# Patient Record
Sex: Male | Born: 1980
Health system: Southern US, Community
[De-identification: ages and names within clinical notes are randomized; demographics above are authoritative.]

## PROBLEM LIST (undated history)

## (undated) DIAGNOSIS — M79602 Pain in left arm: Secondary | ICD-10-CM

## (undated) DIAGNOSIS — F1120 Opioid dependence, uncomplicated: Secondary | ICD-10-CM

## (undated) DIAGNOSIS — R079 Chest pain, unspecified: Secondary | ICD-10-CM

## (undated) DIAGNOSIS — Z789 Other specified health status: Secondary | ICD-10-CM

## (undated) DIAGNOSIS — T7840XA Allergy, unspecified, initial encounter: Secondary | ICD-10-CM

## (undated) DIAGNOSIS — F32A Depression, unspecified: Secondary | ICD-10-CM

## (undated) DIAGNOSIS — S39012A Strain of muscle, fascia and tendon of lower back, initial encounter: Secondary | ICD-10-CM

## (undated) DIAGNOSIS — L739 Follicular disorder, unspecified: Secondary | ICD-10-CM

## (undated) DIAGNOSIS — F1920 Other psychoactive substance dependence, uncomplicated: Secondary | ICD-10-CM

## (undated) DIAGNOSIS — K209 Esophagitis, unspecified without bleeding: Secondary | ICD-10-CM

## (undated) DIAGNOSIS — N50819 Testicular pain, unspecified: Secondary | ICD-10-CM

## (undated) HISTORY — DX: Follicular disorder, unspecified: L73.9

## (undated) HISTORY — DX: Strain of muscle, fascia and tendon of lower back, initial encounter: S39.012A

## (undated) HISTORY — DX: Pain in left arm: M79.602

## (undated) HISTORY — DX: Other specified health status: Z78.9

## (undated) HISTORY — DX: Chest pain, unspecified: R07.9

## (undated) HISTORY — DX: Allergy, unspecified, initial encounter: T78.40XA

## (undated) HISTORY — DX: Esophagitis, unspecified without bleeding: K20.90

## (undated) HISTORY — DX: Testicular pain, unspecified: N50.819

## (undated) HISTORY — DX: Depression, unspecified: F32.A

---

## 1998-04-19 ENCOUNTER — Emergency Department (HOSPITAL_COMMUNITY): Admission: EM | Admit: 1998-04-19 | Discharge: 1998-04-19 | Payer: Self-pay

## 1998-04-22 ENCOUNTER — Encounter: Admission: RE | Admit: 1998-04-22 | Discharge: 1998-04-22 | Payer: Self-pay | Admitting: *Deleted

## 1999-06-24 ENCOUNTER — Encounter: Payer: Self-pay | Admitting: Emergency Medicine

## 1999-06-24 ENCOUNTER — Emergency Department (HOSPITAL_COMMUNITY): Admission: EM | Admit: 1999-06-24 | Discharge: 1999-06-24 | Payer: Self-pay | Admitting: Emergency Medicine

## 1999-11-03 ENCOUNTER — Emergency Department (HOSPITAL_COMMUNITY): Admission: EM | Admit: 1999-11-03 | Discharge: 1999-11-03 | Payer: Self-pay | Admitting: *Deleted

## 2000-02-10 ENCOUNTER — Emergency Department (HOSPITAL_COMMUNITY): Admission: EM | Admit: 2000-02-10 | Discharge: 2000-02-10 | Payer: Self-pay | Admitting: Emergency Medicine

## 2000-08-26 ENCOUNTER — Encounter: Payer: Self-pay | Admitting: Emergency Medicine

## 2000-08-26 ENCOUNTER — Emergency Department (HOSPITAL_COMMUNITY): Admission: EM | Admit: 2000-08-26 | Discharge: 2000-08-26 | Payer: Self-pay

## 2001-06-17 ENCOUNTER — Emergency Department (HOSPITAL_COMMUNITY): Admission: EM | Admit: 2001-06-17 | Discharge: 2001-06-17 | Payer: Self-pay | Admitting: Emergency Medicine

## 2001-08-21 ENCOUNTER — Emergency Department (HOSPITAL_COMMUNITY): Admission: EM | Admit: 2001-08-21 | Discharge: 2001-08-21 | Payer: Self-pay | Admitting: Emergency Medicine

## 2001-08-23 ENCOUNTER — Emergency Department (HOSPITAL_COMMUNITY): Admission: EM | Admit: 2001-08-23 | Discharge: 2001-08-24 | Payer: Self-pay | Admitting: Emergency Medicine

## 2003-04-02 ENCOUNTER — Emergency Department (HOSPITAL_COMMUNITY): Admission: EM | Admit: 2003-04-02 | Discharge: 2003-04-02 | Payer: Self-pay | Admitting: Emergency Medicine

## 2004-05-31 ENCOUNTER — Emergency Department (HOSPITAL_COMMUNITY): Admission: EM | Admit: 2004-05-31 | Discharge: 2004-05-31 | Payer: Self-pay | Admitting: *Deleted

## 2004-06-03 ENCOUNTER — Emergency Department (HOSPITAL_COMMUNITY): Admission: EM | Admit: 2004-06-03 | Discharge: 2004-06-03 | Payer: Self-pay | Admitting: Emergency Medicine

## 2006-12-14 ENCOUNTER — Emergency Department (HOSPITAL_COMMUNITY): Admission: EM | Admit: 2006-12-14 | Discharge: 2006-12-15 | Payer: Self-pay | Admitting: Emergency Medicine

## 2007-01-09 ENCOUNTER — Encounter: Admission: RE | Admit: 2007-01-09 | Discharge: 2007-01-09 | Payer: Self-pay | Admitting: Family Medicine

## 2007-01-09 ENCOUNTER — Ambulatory Visit: Payer: Self-pay | Admitting: Family Medicine

## 2007-04-23 ENCOUNTER — Ambulatory Visit: Payer: Self-pay | Admitting: Family Medicine

## 2007-09-30 ENCOUNTER — Ambulatory Visit: Payer: Self-pay | Admitting: Family Medicine

## 2007-09-30 DIAGNOSIS — M549 Dorsalgia, unspecified: Secondary | ICD-10-CM

## 2007-09-30 HISTORY — DX: Dorsalgia, unspecified: M54.9

## 2007-12-09 ENCOUNTER — Emergency Department (HOSPITAL_COMMUNITY): Admission: EM | Admit: 2007-12-09 | Discharge: 2007-12-09 | Payer: Self-pay | Admitting: Emergency Medicine

## 2007-12-11 DIAGNOSIS — R071 Chest pain on breathing: Secondary | ICD-10-CM

## 2007-12-11 HISTORY — DX: Chest pain on breathing: R07.1

## 2007-12-12 ENCOUNTER — Ambulatory Visit: Payer: Self-pay | Admitting: Family Medicine

## 2007-12-18 ENCOUNTER — Telehealth (INDEPENDENT_AMBULATORY_CARE_PROVIDER_SITE_OTHER): Payer: Self-pay | Admitting: *Deleted

## 2007-12-19 ENCOUNTER — Ambulatory Visit: Payer: Self-pay | Admitting: Family Medicine

## 2007-12-19 DIAGNOSIS — F17200 Nicotine dependence, unspecified, uncomplicated: Secondary | ICD-10-CM

## 2007-12-19 HISTORY — DX: Nicotine dependence, unspecified, uncomplicated: F17.200

## 2008-04-27 ENCOUNTER — Ambulatory Visit: Payer: Self-pay | Admitting: Family Medicine

## 2008-05-04 ENCOUNTER — Encounter: Payer: Self-pay | Admitting: Family Medicine

## 2008-05-04 ENCOUNTER — Telehealth: Payer: Self-pay | Admitting: Family Medicine

## 2008-05-05 ENCOUNTER — Ambulatory Visit: Payer: Self-pay | Admitting: Family Medicine

## 2008-05-07 ENCOUNTER — Ambulatory Visit: Payer: Self-pay | Admitting: Family Medicine

## 2008-05-14 ENCOUNTER — Encounter: Admission: RE | Admit: 2008-05-14 | Discharge: 2008-05-14 | Payer: Self-pay | Admitting: Orthopedic Surgery

## 2008-05-16 ENCOUNTER — Encounter: Admission: RE | Admit: 2008-05-16 | Discharge: 2008-05-16 | Payer: Self-pay | Admitting: Orthopedic Surgery

## 2008-05-28 ENCOUNTER — Telehealth: Payer: Self-pay | Admitting: Family Medicine

## 2008-05-31 ENCOUNTER — Telehealth: Payer: Self-pay | Admitting: Family Medicine

## 2008-06-01 ENCOUNTER — Telehealth: Payer: Self-pay | Admitting: *Deleted

## 2008-06-02 ENCOUNTER — Encounter: Admission: RE | Admit: 2008-06-02 | Discharge: 2008-06-02 | Payer: Self-pay | Admitting: Orthopedic Surgery

## 2008-07-20 ENCOUNTER — Encounter: Admission: RE | Admit: 2008-07-20 | Discharge: 2008-08-03 | Payer: Self-pay | Admitting: Orthopedic Surgery

## 2008-09-14 DIAGNOSIS — M719 Bursopathy, unspecified: Secondary | ICD-10-CM

## 2008-09-14 DIAGNOSIS — M67919 Unspecified disorder of synovium and tendon, unspecified shoulder: Secondary | ICD-10-CM | POA: Insufficient documentation

## 2008-09-14 HISTORY — DX: Unspecified disorder of synovium and tendon, unspecified shoulder: M67.919

## 2008-09-14 HISTORY — DX: Unspecified disorder of synovium and tendon, unspecified shoulder: M71.9

## 2008-09-28 ENCOUNTER — Ambulatory Visit: Payer: Self-pay | Admitting: Family Medicine

## 2008-10-04 ENCOUNTER — Telehealth: Payer: Self-pay | Admitting: *Deleted

## 2008-11-04 ENCOUNTER — Ambulatory Visit: Payer: Self-pay | Admitting: Family Medicine

## 2009-05-18 ENCOUNTER — Encounter
Admission: RE | Admit: 2009-05-18 | Discharge: 2009-08-16 | Payer: Self-pay | Admitting: Physical Medicine & Rehabilitation

## 2009-05-19 ENCOUNTER — Ambulatory Visit: Payer: Self-pay | Admitting: Physical Medicine & Rehabilitation

## 2009-06-27 ENCOUNTER — Telehealth: Payer: Self-pay | Admitting: Family Medicine

## 2009-07-13 ENCOUNTER — Encounter
Admission: RE | Admit: 2009-07-13 | Discharge: 2009-07-13 | Payer: Self-pay | Admitting: Physical Medicine & Rehabilitation

## 2009-08-15 ENCOUNTER — Telehealth: Payer: Self-pay | Admitting: Family Medicine

## 2009-08-18 ENCOUNTER — Telehealth: Payer: Self-pay | Admitting: Family Medicine

## 2010-01-28 ENCOUNTER — Emergency Department (HOSPITAL_COMMUNITY): Admission: EM | Admit: 2010-01-28 | Discharge: 2010-01-28 | Payer: Self-pay | Admitting: Emergency Medicine

## 2010-11-30 NOTE — Assessment & Plan Note (Signed)
Summary: WANTS HIS HEART CK/JLS   Vital Signs:  Patient Profile:   30 Years Old Male Weight:      271 pounds Temp:     98.6 degrees F oral Pulse rate:   64 / minute BP sitting:   126 / 88  (right arm)  Vitals Entered By: Kern Reap CMA (November 04, 2008 12:05 PM)                 Chief Complaint:  concerned with heart disease.  History of Present Illness: Phillip Anderson is a 30 year old, married male, smoker, who comes in today because he is concerned about his heart.  His father and mother both have hypertension.  His father is a diabetic.  Phillip Anderson smokes a pack of cigarettes a day, and expresses his desire to quit.  Recently, his wife's father dropped dead of heart attack at age 32.  Risks factors smoking and hypertension    Current Allergies: No known allergies   Past Medical History:    Reviewed history from 09/28/2008 and no changes required:       Unremarkable       tobacco abuse       cystic lesion, right scapula   Family History:    Reviewed history from 12/12/2007 and no changes required:       Family History Diabetes 1st degree relative       Family History Hypertension  Social History:    Reviewed history from 12/12/2007 and no changes required:       Occupation: Clinical research associate distribution       Married       Never Smoked       Alcohol use-no       Drug use-no       Regular exercise-yes   Risk Factors:     Counseled to quit/cut down tobacco use:  yes   Review of Systems      See HPI   Physical Exam  General:     Well-developed,well-nourished,in no acute distress; alert,appropriate and cooperative throughout examination Neck:     No deformities, masses, or tenderness noted. Chest Wall:     No deformities, masses, tenderness or gynecomastia noted. Lungs:     Normal respiratory effort, chest expands symmetrically. Lungs are clear to auscultation, no crackles or wheezes. Heart:     Normal rate and regular rhythm. S1 and S2 normal  without gallop, murmur, click, rub or other extra sounds.    Impression & Recommendations:  Problem # 1:  TOBACCO ABUSE (ICD-305.1) Assessment: Unchanged  His updated medication list for this problem includes:    Chantix Starting Month Pak 0.5 Mg X 11 & 1 Mg X 42 Misc (Varenicline tartrate) ..... Uad   Problem # 2:  FAMILY HISTORY DIABETES 1ST DEGREE RELATIVE (ICD-V18.0) Assessment: Unchanged  Complete Medication List: 1)  Flexeril 10 Mg Tabs (Cyclobenzaprine hcl) .Marland Kitchen.. 1 tab @ bedtime 2)  Vicodin Es 7.5-750 Mg Tabs (Hydrocodone-acetaminophen) .Marland Kitchen.. 1 tab @ bedtime 3)  Chantix Starting Month Pak 0.5 Mg X 11 & 1 Mg X 42 Misc (Varenicline tartrate) .... Uad   Patient Instructions: 1)  start the smoking cessation program, that I have outlined for you.  Set up for fasting blood work next week.  Return to see me a week after your lab work for follow-up 2)  BMP prior to visit, ICD-9:  v70.0 3)  Hepatic Panel prior to visit, ICD-9: 4)  Lipid Panel prior to visit, ICD-9: 5)  TSH prior to visit, ICD-9: 6)  CBC w/ Diff prior to visit, ICD-9: 7)  Urine-dip prior to visit, ICD-9: 8)  HbgA1C prior to visit, ICD-9:   Prescriptions: CHANTIX STARTING MONTH PAK 0.5 MG X 11 & 1 MG X 42 MISC (VARENICLINE TARTRATE) UAD  #1 x 0   Entered and Authorized by:   Roderick Pee MD   Signed by:   Roderick Pee MD on 11/04/2008   Method used:   Print then Give to Patient   RxID:   [id number]  ]

## 2010-11-30 NOTE — Progress Notes (Signed)
Summary: FYI  Phone Note Call from Patient Call back at [phone]   Caller: pt live Call For: Tawanna Cooler Summary of Call: patient wants to talk to [facility]  Initial call taken by: Roselle Locus,  May 28, 2008 8:58 AM  Follow-up for Phone Call        pt needs paperwork filled out. pt is not sure if Dr Tawanna Cooler or if his specialist should fill it out.  pt is going to have a copy of the paperwork faxed here. Follow-up by: Kern Reap CMA,  May 28, 2008 10:05 AM  Additional Follow-up for Phone Call Additional follow up Details #1::        paperwork needs to be filled out by the physician has been treating him Additional Follow-up by: Roderick Pee MD,  May 31, 2008 7:47 AM    Additional Follow-up for Phone Call Additional follow up Details #2::    pt aware Follow-up by: Kern Reap CMA,  May 31, 2008 2:18 PM

## 2010-11-30 NOTE — Progress Notes (Signed)
Summary: neurologic appointment  Phone Note Call from Patient Call back at [phone]   Summary of Call: patient is calling because he is going to the neurologist for an appointment soon.  is there anything he can do for his pain until then? Initial call taken by: Kern Reap CMA Duncan Dull),  August 18, 2009 12:39 PM  Follow-up for Phone Call         we need to name of the neurologist and  when his appointment is???????????  then Fleet Contras we need to call and verify the above to be able to give him a short quantity of rx Follow-up by: Roderick Pee MD,  August 18, 2009 12:52 PM  Additional Follow-up for Phone Call Additional follow up Details #1::        patient does not have an appointment yet.  his information has been faxed to [employer] [facility]. and he is waiting to hear back from them. Additional Follow-up by: Kern Reap CMA Duncan Dull),  August 18, 2009 3:57 PM

## 2010-11-30 NOTE — Letter (Signed)
Summary: Out of Work  Adult nurse at [employer]  [address]   [place], [place] [address]   Phone: [phone]  Fax: [phone]    April 27, 2008   Employee:  AITAN ROSSBACH Patel    To Whom It May Concern:   For Medical reasons, please excuse the above named employee from work for the following dates:  Start:   April 27, 2008    End:   May 29, 2008  If you need additional information, please feel free to contact our office.         Sincerely,    Kelle Darting, MD

## 2010-11-30 NOTE — Progress Notes (Signed)
Summary: pt no better  Phone Note Call from Patient Call back at Home Phone [phone]   Caller: Patient Call For: dt todd Reason for Call: Talk to Doctor Summary of Call: pt back pain no better pt would like something [phone] Initial call taken by: Heron Sabins,  December 18, 2007 9:08 AM  Follow-up for Phone Call        PT CALLED AGAIN FOR STATUS. ..................................................................Marland KitchenWarnell Forester  December 18, 2007 11:13 AM  Additional Follow-up for Phone Call Additional follow up Details #1::        patient taking Vicodin and Flexeril 3 times a day.  If that's not controlling his pain we need to see him today for re-examination Additional Follow-up by: Roderick Pee MD,  December 18, 2007 1:30 PM    Additional Follow-up for Phone Call Additional follow up Details #2::    pt called and he made an appt for tomorrow am per dr's order. Follow-up by: Warnell Forester,  December 18, 2007 4:29 PM

## 2010-11-30 NOTE — Progress Notes (Signed)
Summary: patient asks for your referral again  Phone Note Call from Patient Call back at [phone]   Caller: Patient Call For: todd Reason for Call: Talk to Nurse Summary of Call: Having back pain, would like to speak with nurse to see what he should do.  Called patient.  He is requesting refill of pain med & muscle relaxer so he can get some sleep.  Feels so bad he wants to get it cut out.  aPPOINTMENT MADE wED 10-20 AT 11:00, he can't get here until then.  No physician [place].  [facility] [address] [place], [place].  Request call back.   [phone].  NKDA.   Initial call taken by: Trixie Dredge,  August 15, 2009 10:48 AM  Follow-up for Phone Call        patient was referred to Dr. Omelia Blackwater [facility] recommend he see one of those two people for further evaluation since we were not able to find a diagnosis Follow-up by: Roderick Pee MD,  August 15, 2009 1:45 PM  Additional Follow-up for Phone Call Additional follow up Details #1::        Patient said he wen to Dr. Cleophas Dunker who sent him to Dr. Madelon Lips who sent him to [facility], a Dr. [place]?, sho sent him back to another [place] doctor?  What to do or who to go to for surgery?   Additional Follow-up by: Rudy Jew, RN,  August 15, 2009 3:00 PM    Additional Follow-up for Phone Call Additional follow up Details #2::    I would recommend that he see the last person.  He saw Follow-up by: Roderick Pee MD,  August 15, 2009 3:04 PM  Additional Follow-up for Phone Call Additional follow up Details #3:: Details for Additional Follow-up Action Taken: patient is aware Additional Follow-up by: Kern Reap CMA Duncan Dull),  August 15, 2009 5:28 PM   Appended Document: Orders Update    Clinical Lists Changes  Orders: Added new Referral order of Neurology Referral (Neuro) - Signed

## 2010-11-30 NOTE — Assessment & Plan Note (Signed)
Summary: left arm pain/mhf   Vital Signs:  Patient Profile:   30 Years Old Male Weight:      272 pounds Temp:     98.3 degrees F oral Pulse rate:   78 / minute Pulse rhythm:   regular BP sitting:   130 / 90  (left arm) Cuff size:   regular  Vitals Entered By: Kern Reap CMA (September 28, 2008 2:31 PM)                 Chief Complaint:  left shoulder pain.  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  He states two weeks ago.  He woke up with severe pain in his left shoulder.  He denies any history of trauma.  He describes the pain as constant, dull.  It was originally a 7 on a scale of one to 10 ninths and 9.  He also says the pain radiates down into his left biceps.  He's had no history of neck trauma, nor shoulder trauma in the past.  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  He also has trauma to his right shoulder.  We originally sent him to Dr. Cleophas Dunker is seen in the [facility].  It turns out that he has a cyst in his left scapula.  This is being followed at [facility] in    Current Allergies: No known allergies   Past Medical History:    Reviewed history from 12/12/2007 and no changes required:       Unremarkable              cystic lesion, right scapula   Social History:    Reviewed history from 12/12/2007 and no changes required:       Occupation: Clinical research associate distribution       Married       Never Smoked       Alcohol use-no       Drug use-no       Regular exercise-yes   Risk Factors:     Counseled to quit/cut down tobacco use:  yes   Review of Systems      See HPI   Physical Exam  General:     Well-developed,well-nourished,in no acute distress; alert,appropriate and cooperative throughout examination Msk:     palpable tenderness left biceps tendon otherwise, exam negative Pulses:  R and L carotid,radial,femoral,dorsalis pedis and posterior tibial pulses are full and equal bilaterally Extremities:     No clubbing, cyanosis, edema, or deformity noted with normal full range of motion of all joints.   Neurologic:     No cranial nerve deficits noted. Station and gait are normal. Plantar reflexes are down-going bilaterally. DTRs are symmetrical throughout. Sensory, motor and coordinative functions appear intact.    Impression & Recommendations:  Problem # 1:  TOBACCO ABUSE (ICD-305.1) Assessment: Unchanged  Orders: Tobacco use cessation intermediate 3-10 minutes (99406)   Problem # 2:  TENDINITIS, SHOULDER, LEFT (ICD-726.10) Assessment: New  Orders: Tobacco use cessation intermediate 3-10 minutes (99406)   Complete Medication List: 1)  Flexeril 10 Mg Tabs (Cyclobenzaprine hcl) .Marland Kitchen.. 1 tab @ bedtime 2)  Vicodin Es 7.5-750 Mg Tabs (Hydrocodone-acetaminophen) .Marland Kitchen.. 1 tab @ bedtime   Patient Instructions: 1)  take Motrin, 600 mg 3 times a day with food.  It may also take a half  of Vicodin and a half of a Flexeril at bedtime as needed for nighttime pain.  If you don't see any improvement in a week.  Call me we will get you set up for physical therapy. 2)  Please call the doctors at [facility] and have them send me a copy of their findings   Prescriptions: VICODIN ES 7.5-750 MG TABS (HYDROCODONE-ACETAMINOPHEN) 1 tab @ bedtime  #30 x 2   Entered and Authorized by:   Roderick Pee MD   Signed by:   Roderick Pee MD on 09/28/2008   Method used:   Print then Give to Patient   RxID:   [id number] FLEXERIL 10 MG TABS (CYCLOBENZAPRINE HCL) 1 tab @ bedtime  #30 x 2   Entered and Authorized by:   Roderick Pee MD   Signed by:   Roderick Pee MD on 09/28/2008   Method used:   Print then Give to Patient   RxID:   [id number]  ]

## 2010-11-30 NOTE — Progress Notes (Signed)
Summary: needs copies of out of work notes  Phone Note Call from Patient Call back at [employer] [phone]   Caller: pt wife live Call For: Tawanna Cooler Summary of Call: The insurance company needs a copy of the out of work notes that Dr Tawanna Cooler wrote for him.  They need to be faxed to the insurance company  on the paperwork faxed to Dr Tawanna Cooler Initial call taken by: Roselle Locus,  May 31, 2008 3:55 PM  Follow-up for Phone Call        faxed Follow-up by: Kern Reap CMA,  June 01, 2008 8:59 AM

## 2010-11-30 NOTE — Letter (Signed)
Summary: Out of Work  Adult nurse at [employer]  [address]   [place], [place] [address]   Phone: [phone]  Fax: [phone]    May 04, 2008   Employee:  Phillip Anderson    To Whom It May Concern:   For Medical reasons, please excuse the above named employee from work for the following dates:  Start:   See previous out of work note.  End:   05-07-08  If you need additional information, please feel free to contact our office.         Sincerely,    Rudy Jew, RN

## 2010-11-30 NOTE — Progress Notes (Signed)
  Phone Note Call from Patient   Caller: Patient Call For: Phillip Anderson Summary of Call: out-of town and hurt his back moving-same as before. Dr Tawanna Cooler gave him Rx but he never got it filled and it's expired. Can you call in Rx to pharmacy in [place], [place]? ph- [phone]  His cell- [phone] Initial call taken by: Raechel Ache, RN,  June 27, 2009 9:19 AM  Follow-up for Phone Call        Flexeril 10 mg, number 30, directions one nightly for low back pain, no refills, and Vicodin ES number 30 directions one nightly for back pain.  No refills.  Office visit with me this week.  If symptoms do not improve in a couple days or they get worse Follow-up by: Roderick Pee MD,  June 27, 2009 9:52 AM  Additional Follow-up for Phone Call Additional follow up Details #1::        Rx Called In Additional Follow-up by: Raechel Ache, RN,  June 27, 2009 9:58 AM

## 2010-11-30 NOTE — Progress Notes (Signed)
Summary: new rx   Phone Note Call from Patient Call back at [phone]   Caller: pt live Call For: Tawanna Cooler Summary of Call: Patient had to leave town because of a death in the family, he left his meds at home he would like you to call in Hydrocondone 7.5mg .  [facility] [place], [phone] Initial call taken by: Celine Ahr,  October 04, 2008 4:18 PM  Follow-up for Phone Call        ok dispense 20 tablets, Vicodin ES directions one t.i.d. p.r.n. for pain.  No refills Follow-up by: Roderick Pee MD,  October 05, 2008 8:22 AM  Additional Follow-up for Phone Call Additional follow up Details #1::        rx called in left message on machine for patient  Additional Follow-up by: Kern Reap CMA,  October 05, 2008 9:30 AM

## 2010-11-30 NOTE — Assessment & Plan Note (Signed)
Summary: back pain/ccm   Vital Signs:  Patient Profile:   30 Years Old Male Weight:      261 pounds Temp:     98.4 degrees F Pulse rate:   84 / minute BP sitting:   120 / 92  Vitals Entered By: Gladis Riffle, RN (December 19, 2007 11:29 AM)                 Chief Complaint:  c/o back pain --states was seen last week and flexeril and vicodin of no help.  History of Present Illness: Phillip Anderson is a 30 year old male, who comes in today for evaluation of right and left posterior chest wall pain.  He was seen a week ago for this problem.  At that time and been sore for 5 days.  He denied any history of trauma.  Exam at that time was negative.  He was given Flexeril 10 mg 3 times a day, along with Vicodin ES 3 times a day.  He comes back today saying his pain is worse.  On a scale of one to 10 it to 10.  It hasn't changed.  It still sharp and constant.  He has no shortness or breath, fever, chills, cough, etc.  Again, review of systems negative  patient states his pain is a 10 every sitting very comfortably with no obvious signs of discomfort.  I saw him get out of his car and walk in the office without any signs of discomfort.    Current Allergies: No known allergies   Past Medical History:    Reviewed history from 12/12/2007 and no changes required:       Unremarkable    Risk Factors:  Tobacco use:  current    Counseled to quit/cut down tobacco use:  yes   Review of Systems      See HPI   Physical Exam  General:     Well-developed,well-nourished,in no acute distress; alert,appropriate and cooperative throughout examination Head:     Normocephalic and atraumatic without obvious abnormalities. No apparent alopecia or balding. Eyes:     No corneal or conjunctival inflammation noted. EOMI. Perrla. Funduscopic exam benign, without hemorrhages, exudates or papilledema. Vision grossly normal. Ears:     External ear exam shows no significant lesions or deformities.  Otoscopic  examination reveals clear canals, tympanic membranes are intact bilaterally without bulging, retraction, inflammation or discharge. Hearing is grossly normal bilaterally. Nose:     External nasal examination shows no deformity or inflammation. Nasal mucosa are pink and moist without lesions or exudates. Mouth:     Oral mucosa and oropharynx without lesions or exudates.  Teeth in good repair. Neck:     No deformities, masses, or tenderness noted. Chest Wall:     the posterior chest wall is tender to palpation.  No bony pain Lungs:     Normal respiratory effort, chest expands symmetrically. Lungs are clear to auscultation, no crackles or wheezes. Heart:     Normal rate and regular rhythm. S1 and S2 normal without gallop, murmur, click, rub or other extra sounds.    Impression & Recommendations:  Problem # 1:  CHEST WALL PAIN, ACUTE (EAV-409.81) Assessment: Deteriorated  Orders: T-2 View CXR, Same Day (71020.5TC) Physical Therapy Referral (PT)   Problem # 2:  TOBACCO ABUSE (ICD-305.1) Assessment: New  Orders: Tobacco use cessation intermediate 3-10 minutes (99406)   Complete Medication List: 1)  Flexeril 10 Mg Tabs (Cyclobenzaprine hcl) .... Take 1 tablet by mouth three times a day  2)  Vicodin Es 7.5-750 Mg Tabs (Hydrocodone-acetaminophen) .... Take 1 tablet by mouth three times a day   Patient Instructions: 1)  your chest x-ray is normal.  I think your having persistent chest wall pain.  In addition to your medication will be to set up on Monday for PT consult.    ]
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
## 2010-11-30 NOTE — Assessment & Plan Note (Signed)
Summary: knot in back/ pat has been sick all wee/jls   Vital Signs:  Patient Profile:   30 Years Old Male Weight:      258 pounds (117.27 kg) Temp:     98.0 degrees F (36.67 degrees C) oral Pulse rate:   81 / minute BP sitting:   121 / 95  (right arm)  Pt. in pain?   yes    Location:   back    Intensity:   10    Type:       sharp  Vitals Entered By: Arcola Jansky, RN (December 12, 2007 12:40 PM)                  Chief Complaint:  " T  102 x 1 wk, went to ED , upper back pain, laid in the bed for a week, and feels like a knot " radiating back pain .  History of Present Illness: Phillip Anderson is a 30 year old male, who comes in today for evaluation of left subscapular back pain.  Early on the week he developed fever, chills ache all over, and sore throat.  He went to the emergency room for evaluation.  He was told he had a viral infection and was given some hydrocodone cough syrup.  Yesterday he developed the sudden onset of severe left scapular pain.  He described as sharp like a knife on a scale of one to 10 it to 10.  It does not radiate.  It is viral symptoms are gone.  He has no cough, et Karie Soda.    Past Medical History:    Reviewed history and no changes required:       Unremarkable   Family History:    Reviewed history and no changes required:       Family History Diabetes 1st degree relative       Family History Hypertension  Social History:    Reviewed history and no changes required:       Occupation: Clinical research associate distribution       Married       Never Smoked       Alcohol use-no       Drug use-no       Regular exercise-yes   Risk Factors:  Tobacco use:  never Drug use:  no Alcohol use:  no Exercise:  yes   Review of Systems      See HPI   Physical Exam  General:     Well-developed,well-nourished,in no acute distress; alert,appropriate and cooperative throughout examination Head:     Normocephalic and atraumatic  without obvious abnormalities. No apparent alopecia or balding. Eyes:     No corneal or conjunctival inflammation noted. EOMI. Perrla. Funduscopic exam benign, without hemorrhages, exudates or papilledema. Vision grossly normal. Ears:     External ear exam shows no significant lesions or deformities.  Otoscopic examination reveals clear canals, tympanic membranes are intact bilaterally without bulging, retraction, inflammation or discharge. Hearing is grossly normal bilaterally. Nose:     External nasal examination shows no deformity or inflammation. Nasal mucosa are pink and moist without lesions or exudates. Mouth:     Oral mucosa and oropharynx without lesions or exudates.  Teeth in good repair. Neck:     No deformities, masses, or tenderness noted. Chest Wall:     there is extreme tenderness below the left subscapular area. Lungs:     Normal respiratory effort, chest expands symmetrically. Lungs are clear to auscultation,  no crackles or wheezes. Heart:     Normal rate and regular rhythm. S1 and S2 normal without gallop, murmur, click, rub or other extra sounds.    Impression & Recommendations:  Problem # 1:  CHEST WALL PAIN, ACUTE (ZOX-096.04) Assessment: New  Complete Medication List: 1)  Flexeril 10 Mg Tabs (Cyclobenzaprine hcl) .Marland Kitchen.. 1 tab @ bedtime 2)  Vicodin Es 7.5-750 Mg Tabs (Hydrocodone-acetaminophen) .Marland Kitchen.. 1 tab @ bedtime 3)  Flexeril 10 Mg Tabs (Cyclobenzaprine hcl) .... Take 1 tablet by mouth three times a day 4)  Vicodin Es 7.5-750 Mg Tabs (Hydrocodone-acetaminophen) .... Take 1 tablet by mouth three times a day   Patient Instructions: 1)  take Flexeril, 10 mg one half tablet 3 times a day, along with Vicodin ES one half tablet 3 times a day. 2)  Please schedule a follow-up appointment as needed.    Prescriptions: VICODIN ES 7.5-750 MG  TABS (HYDROCODONE-ACETAMINOPHEN) Take 1 tablet by mouth three times a day  #30 x 1   Entered and Authorized by:   Roderick Pee MD   Signed by:   Roderick Pee MD on 12/12/2007   Method used:   Print then Give to Patient   RxID:   [id number] FLEXERIL 10 MG  TABS (CYCLOBENZAPRINE HCL) Take 1 tablet by mouth three times a day  #30 x 1   Entered and Authorized by:   Roderick Pee MD   Signed by:   Roderick Pee MD on 12/12/2007   Method used:   Print then Give to Patient   RxID:   [id number]  ]

## 2010-11-30 NOTE — Progress Notes (Signed)
Summary: still needs to be out of work  Phone Note Call from Patient Call back at [phone]   Call For: todd Summary of Call: Unable to return to work on 7-6-9 as note says.  Pulled muscle under shoulder blade.  He's a brickmason.  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 Tawanna Cooler tomorrow and needs to get another out of work note through the end of this week.  Note done because he needs it to turn tomorrow.  Getting xray of his spine today taht Dr. Tawanna Cooler ordered. Initial call taken by: Rudy Jew, RN,  May 04, 2008 3:29 PM

## 2010-11-30 NOTE — Letter (Signed)
Summary: Out of Work  Adult nurse at [employer]  [address]   [place], [place] [address]   Phone: [phone]  Fax: [phone]    April 27, 2008   Employee:  G[employer] Milewski    To Whom It May Concern:   For Medical reasons, please excuse the above named employee from work for the following dates:  Start:   March 29, 2008  End:   May 03, 2008  If you need additional information, please feel free to contact our office.         Sincerely,    Kelle Darting, MD

## 2010-11-30 NOTE — Progress Notes (Signed)
Summary: please call the wife   Phone Note Call from Patient Call back at Home Phone [phone]   Caller: pt wife live Call For: Tawanna Cooler Summary of Call: Patient is calling back again about paperwork for [employer] and out of work notes for [employer].  Please call the wife and discuss what needs to be sent to who.   Initial call taken by: Roselle Locus,  June 01, 2008 3:32 PM  Follow-up for Phone Call        left message on machine for patient to return call Follow-up by: Kern Reap CMA,  June 02, 2008 8:52 AM  Additional Follow-up for Phone Call Additional follow up Details #1::        left message on machine for patient to return call.  letters were faxed to insurance company Additional Follow-up by: Kern Reap CMA,  June 04, 2008 9:11 AM
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
## 2011-03-01 ENCOUNTER — Emergency Department (HOSPITAL_COMMUNITY)
Admission: EM | Admit: 2011-03-01 | Discharge: 2011-03-02 | Disposition: A | Discharge status: Home / Self Care | Payer: Medicaid Other | Attending: Emergency Medicine | Admitting: Emergency Medicine

## 2011-03-01 DIAGNOSIS — M549 Dorsalgia, unspecified: Secondary | ICD-10-CM | POA: Insufficient documentation

## 2011-03-01 DIAGNOSIS — G8929 Other chronic pain: Secondary | ICD-10-CM | POA: Insufficient documentation
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
## 2011-03-24 ENCOUNTER — Emergency Department (HOSPITAL_COMMUNITY)
Admission: EM | Admit: 2011-03-24 | Discharge: 2011-03-24 | Disposition: A | Discharge status: Home / Self Care | Payer: Medicaid Other | Attending: Emergency Medicine | Admitting: Emergency Medicine

## 2011-03-24 DIAGNOSIS — R609 Edema, unspecified: Secondary | ICD-10-CM | POA: Insufficient documentation

## 2011-03-24 DIAGNOSIS — T6391XA Toxic effect of contact with unspecified venomous animal, accidental (unintentional), initial encounter: Secondary | ICD-10-CM | POA: Insufficient documentation

## 2011-03-24 DIAGNOSIS — T63461A Toxic effect of venom of wasps, accidental (unintentional), initial encounter: Secondary | ICD-10-CM | POA: Insufficient documentation

## 2011-03-25 ENCOUNTER — Emergency Department (HOSPITAL_COMMUNITY): Payer: Medicaid Other

## 2011-03-25 ENCOUNTER — Emergency Department (HOSPITAL_COMMUNITY)
Admission: EM | Admit: 2011-03-25 | Discharge: 2011-03-25 | Discharge status: Against Medical Advice | Payer: Medicaid Other | Attending: Emergency Medicine | Admitting: Emergency Medicine

## 2011-03-25 DIAGNOSIS — IMO0002 Reserved for concepts with insufficient information to code with codable children: Secondary | ICD-10-CM | POA: Insufficient documentation

## 2011-03-25 DIAGNOSIS — M25519 Pain in unspecified shoulder: Secondary | ICD-10-CM | POA: Insufficient documentation

## 2011-03-25 DIAGNOSIS — M25559 Pain in unspecified hip: Secondary | ICD-10-CM | POA: Insufficient documentation

## 2011-03-25 DIAGNOSIS — M25579 Pain in unspecified ankle and joints of unspecified foot: Secondary | ICD-10-CM | POA: Insufficient documentation

## 2011-03-26 ENCOUNTER — Emergency Department (HOSPITAL_COMMUNITY)
Admission: EM | Admit: 2011-03-26 | Discharge: 2011-03-26 | Disposition: A | Discharge status: Home / Self Care | Payer: Medicaid Other | Attending: Emergency Medicine | Admitting: Emergency Medicine

## 2011-03-26 DIAGNOSIS — Z23 Encounter for immunization: Secondary | ICD-10-CM | POA: Insufficient documentation

## 2011-03-26 DIAGNOSIS — S60459A Superficial foreign body of unspecified finger, initial encounter: Secondary | ICD-10-CM | POA: Insufficient documentation

## 2011-03-26 DIAGNOSIS — W268XXA Contact with other sharp object(s), not elsewhere classified, initial encounter: Secondary | ICD-10-CM | POA: Insufficient documentation

## 2011-07-20 LAB — RAPID STREP SCREEN (MED CTR MEBANE ONLY): Streptococcus, Group A Screen (Direct): NEGATIVE

## 2012-02-18 ENCOUNTER — Emergency Department (HOSPITAL_COMMUNITY): Payer: Self-pay

## 2012-02-18 ENCOUNTER — Emergency Department (HOSPITAL_COMMUNITY)
Admission: EM | Admit: 2012-02-18 | Discharge: 2012-02-18 | Disposition: A | Discharge status: Home / Self Care | Payer: Self-pay | Attending: Emergency Medicine | Admitting: Emergency Medicine

## 2012-02-18 ENCOUNTER — Encounter (HOSPITAL_COMMUNITY): Payer: Self-pay

## 2012-02-18 DIAGNOSIS — S8012XA Contusion of left lower leg, initial encounter: Secondary | ICD-10-CM

## 2012-02-18 DIAGNOSIS — IMO0002 Reserved for concepts with insufficient information to code with codable children: Secondary | ICD-10-CM | POA: Insufficient documentation

## 2012-02-18 DIAGNOSIS — Y990 Civilian activity done for income or pay: Secondary | ICD-10-CM | POA: Insufficient documentation

## 2012-02-18 DIAGNOSIS — S8010XA Contusion of unspecified lower leg, initial encounter: Secondary | ICD-10-CM | POA: Insufficient documentation

## 2012-02-18 DIAGNOSIS — Y9289 Other specified places as the place of occurrence of the external cause: Secondary | ICD-10-CM | POA: Insufficient documentation

## 2012-02-18 DIAGNOSIS — M79609 Pain in unspecified limb: Secondary | ICD-10-CM | POA: Insufficient documentation

## 2012-02-18 MED ORDER — HYDROCODONE-ACETAMINOPHEN 5-325 MG PO TABS
1.0000 | ORAL_TABLET | Freq: Once | ORAL | Status: AC
  Administered 2012-02-18: 1 via ORAL
  Filled 2012-02-18: qty 1

## 2012-02-18 MED ORDER — TRAMADOL HCL 50 MG PO TABS: 50.0000 mg | ORAL_TABLET | Freq: Four times a day (QID) | ORAL | Status: AC | PRN

## 2012-02-18 NOTE — ED Notes (Signed)
Pt reports he hurt leg at work a week ago. 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 Pt says the bar hit him mid-shin on left leg.  Pt has tried ibuprofen without relief. Pt is able to ambulate.

## 2012-02-18 NOTE — ED Notes (Signed)
Pt states that he hit his lower leg about one week ago and hes still having pain

## 2012-02-18 NOTE — Discharge Instructions (Signed)
YOUR XRAY IS NEGATIVE FOR ANY BROKEN BONES. YOUR SYMPTOMS WILL RESOLVE OVER TIME. TAKE ULTRAM FOR PAIN AND FOLLOW UP WITH YOUR CHOICE OF PRIMARY CARE PHYSICIAN IF SYMPTOMS PERSIST FOR RECHECK.  Contusion A contusion is a deep bruise. Contusions happen when an injury causes bleeding under the skin. Signs of bruising include pain, puffiness (swelling), and discolored skin. The contusion may turn blue, purple, or yellow. HOME CARE   Put ice on the injured area.   Put ice in a plastic bag.   Place a towel between your skin and the bag.   Leave the ice on for 15 to 20 minutes, 3 to 4 times a day.   Only take medicine as told by your doctor.   Rest the injured area.   If possible, raise (elevate) the injured area to lessen puffiness.  GET HELP RIGHT AWAY IF:   You have more bruising or puffiness.   You have pain that is getting worse.   Your puffiness or pain is not helped by medicine.  MAKE SURE YOU:   Understand these instructions.   Will watch your condition.   Will get help right away if you are not doing well or get worse.  Document Released: 04/02/2008 Document Revised: 10/04/2011 Document Reviewed: 08/20/2011 [facility] Patient Information 2012 [place], [place].

## 2012-02-18 NOTE — ED Provider Notes (Signed)
History     CSN: [id number]  Arrival date & time 02/18/12  1904   First MD Initiated Contact with Patient 02/18/12 2020      Chief Complaint  Patient presents with  . Leg Pain    (Consider location/radiation/quality/duration/timing/severity/associated sxs/prior treatment) Patient is a 31 y.o. male presenting with leg pain. The history is provided by the patient.  Leg Pain  The incident occurred more than 1 week ago. The incident occurred at work. The injury mechanism was a direct blow. The pain is present in the left leg. Associated symptoms comments: He hit his left lower leg a week ago and complains of persistent pain when weight bearing. Marland Kitchen    History reviewed. No pertinent past medical history.  History reviewed. No pertinent past surgical history.  History reviewed. No pertinent family history.  History  Substance Use Topics  . Smoking status: Not on file  . Smokeless tobacco: Not on file  . Alcohol Use: Yes      Review of Systems  Respiratory: Negative for shortness of breath.   Cardiovascular: Negative for chest pain.  Musculoskeletal:       See HPI.    Allergies  Review of patient's allergies indicates no known allergies.  Home Medications   Current Outpatient Rx  Name Route Sig Dispense Refill  . IBUPROFEN 200 MG PO TABS Oral Take 400 mg by mouth every 6 (six) hours as needed.      BP 135/80  Pulse 74  Temp(Src) 98.6 F (37 C) (Oral)  Resp 18  Wt 230 lb (104.327 kg)  SpO2 100%  Physical Exam  Constitutional: He is oriented to person, place, and time. He appears well-developed and well-nourished.  Neck: Normal range of motion.  Pulmonary/Chest: Effort normal.  Musculoskeletal: Normal range of motion.       Mild swelling over proximal tibia left leg without bony deformity. No discoloration. Knee joint stable without tenderness.   Neurological: He is alert and oriented to person, place, and time.  Skin: Skin is warm and dry.  Psychiatric: He  has a normal mood and affect.    ED Course  Procedures (including critical care time)  Labs Reviewed - No data to display Dg Tibia/fibula Left  02/18/2012  *RADIOLOGY REPORT*  Clinical Data: .  Pain Post blunt trauma  LEFT TIBIA AND FIBULA - 2 VIEW  Comparison: 03/25/2011  Findings: Negative for fracture, dislocation, or other acute abnormality.  Normal alignment and mineralization. No significant degenerative change.  Regional soft tissues unremarkable.  IMPRESSION:  Negative  Original Report Authenticated By: Osa Craver, M.D.     No diagnosis found. 1. Contusion left shin   MDM  Negative xray - supports contusion injury to shin.        Rodena Medin, PA-C 02/18/12 2122

## 2012-02-20 NOTE — ED Provider Notes (Signed)
History/physical exam/procedure(s) were performed by non-physician practitioner and as supervising physician I was immediately available for consultation/collaboration. I have reviewed all notes and am in agreement with care and plan.   Shandale Malak S Treshon Stannard, MD 02/20/12 1540 

## 2012-03-24 ENCOUNTER — Telehealth (HOSPITAL_COMMUNITY): Payer: Self-pay | Admitting: *Deleted

## 2012-03-24 ENCOUNTER — Encounter (HOSPITAL_COMMUNITY): Payer: Self-pay

## 2012-03-24 ENCOUNTER — Inpatient Hospital Stay (HOSPITAL_COMMUNITY)
Admission: AD | Admit: 2012-03-24 | Discharge: 2012-03-26 | DRG: 897 | Disposition: A | Discharge status: Home / Self Care | Payer: Federal, State, Local not specified - Other | Source: Ambulatory Visit | Attending: Psychiatry | Admitting: Psychiatry

## 2012-03-24 ENCOUNTER — Emergency Department (HOSPITAL_COMMUNITY)
Admission: EM | Admit: 2012-03-24 | Discharge: 2012-03-24 | Disposition: A | Discharge status: Other Acute Inpatient Hospital | Payer: Self-pay | Attending: Emergency Medicine | Admitting: Emergency Medicine

## 2012-03-24 ENCOUNTER — Encounter (HOSPITAL_COMMUNITY): Payer: Self-pay | Admitting: *Deleted

## 2012-03-24 ENCOUNTER — Encounter (HOSPITAL_COMMUNITY): Payer: Self-pay | Admitting: Emergency Medicine

## 2012-03-24 DIAGNOSIS — F1920 Other psychoactive substance dependence, uncomplicated: Secondary | ICD-10-CM

## 2012-03-24 DIAGNOSIS — F329 Major depressive disorder, single episode, unspecified: Secondary | ICD-10-CM | POA: Insufficient documentation

## 2012-03-24 DIAGNOSIS — F1994 Other psychoactive substance use, unspecified with psychoactive substance-induced mood disorder: Secondary | ICD-10-CM | POA: Diagnosis present

## 2012-03-24 DIAGNOSIS — F1120 Opioid dependence, uncomplicated: Secondary | ICD-10-CM | POA: Diagnosis present

## 2012-03-24 DIAGNOSIS — F1410 Cocaine abuse, uncomplicated: Principal | ICD-10-CM | POA: Diagnosis present

## 2012-03-24 DIAGNOSIS — F17200 Nicotine dependence, unspecified, uncomplicated: Secondary | ICD-10-CM | POA: Insufficient documentation

## 2012-03-24 DIAGNOSIS — F1210 Cannabis abuse, uncomplicated: Secondary | ICD-10-CM | POA: Diagnosis present

## 2012-03-24 DIAGNOSIS — F3289 Other specified depressive episodes: Secondary | ICD-10-CM | POA: Insufficient documentation

## 2012-03-24 DIAGNOSIS — F32A Depression, unspecified: Secondary | ICD-10-CM

## 2012-03-24 DIAGNOSIS — F1110 Opioid abuse, uncomplicated: Secondary | ICD-10-CM | POA: Diagnosis present

## 2012-03-24 LAB — DIFFERENTIAL
Basophils Absolute: 0 10*3/uL (ref 0.0–0.1)
Basophils Relative: 0 % (ref 0–1)
Eosinophils Absolute: 0.1 10*3/uL (ref 0.0–0.7)
Eosinophils Relative: 1 % (ref 0–5)
Lymphocytes Relative: 17 % (ref 12–46)
Lymphs Abs: 1 10*3/uL (ref 0.7–4.0)
Monocytes Absolute: 0.4 10*3/uL (ref 0.1–1.0)
Monocytes Relative: 7 % (ref 3–12)
Neutro Abs: 4.6 10*3/uL (ref 1.7–7.7)
Neutrophils Relative %: 75 % (ref 43–77)

## 2012-03-24 LAB — RAPID URINE DRUG SCREEN, HOSP PERFORMED
Amphetamines: NOT DETECTED
Barbiturates: NOT DETECTED
Benzodiazepines: NOT DETECTED
Cocaine: POSITIVE — AB
Opiates: NOT DETECTED
Tetrahydrocannabinol: POSITIVE — AB

## 2012-03-24 LAB — COMPREHENSIVE METABOLIC PANEL
ALT: 9 U/L (ref 0–53)
AST: 11 U/L (ref 0–37)
Albumin: 4.3 g/dL (ref 3.5–5.2)
Alkaline Phosphatase: 76 U/L (ref 39–117)
BUN: 7 mg/dL (ref 6–23)
CO2: 27 mEq/L (ref 19–32)
Calcium: 9.2 mg/dL (ref 8.4–10.5)
Chloride: 105 mEq/L (ref 96–112)
Creatinine, Ser: 1.17 mg/dL (ref 0.50–1.35)
GFR calc Af Amer: >90 mL/min (ref >90)
GFR calc non Af Amer: 81 mL/min — ABNORMAL LOW (ref >90)
Glucose, Bld: 124 mg/dL — ABNORMAL HIGH (ref 70–99)
Potassium: 4 mEq/L (ref 3.5–5.1)
Sodium: 141 mEq/L (ref 135–145)
Total Bilirubin: 0.6 mg/dL (ref 0.3–1.2)
Total Protein: 7.4 g/dL (ref 6.0–8.3)

## 2012-03-24 LAB — CBC
HCT: 46.2 % (ref 39.0–52.0)
Hemoglobin: 15.9 g/dL (ref 13.0–17.0)
MCH: 31.5 pg (ref 26.0–34.0)
MCHC: 34.4 g/dL (ref 30.0–36.0)
MCV: 91.7 fL (ref 78.0–100.0)
Platelets: 181 10*3/uL (ref 150–400)
RBC: 5.04 MIL/uL (ref 4.22–5.81)
RDW: 12.6 % (ref 11.5–15.5)
WBC: 6.1 10*3/uL (ref 4.0–10.5)

## 2012-03-24 LAB — ETHANOL: Alcohol, Ethyl (B): <11 mg/dL (ref 0–11)

## 2012-03-24 NOTE — ED Notes (Signed)
Pt here for medical clearance.

## 2012-03-24 NOTE — BH Assessment (Signed)
Lynda Capistran is 31 year old male. Seen at [facility] for taking 3 kinds of pills, not specified, in an attempt to die. Pt has been kicked out by wife, lost housing and marriage. Has 2 children living with wife. Lost his job last week. Now living with sister. No ability to see beyond this stress, on IVC by family. While he says he was drinking occasionally and smoking THC occasionally he stopped completely, unclear time ago, and depression has been worse. Pt positive for cocaine and THC in UDS Sent for medical clearance at [facility]. Accepted for in patient admission by Kari Baars NP.

## 2012-03-24 NOTE — Tx Team (Signed)
Initial Interdisciplinary Treatment Plan  PATIENT STRENGTHS: (choose at least two) Ability for insight Active sense of humor Capable of independent living Communication skills General fund of knowledge Physical Health  PATIENT STRESSORS: Financial difficulties Marital or family conflict Occupational concerns Substance abuse   PROBLEM LIST: Problem List/Patient Goals Date to be addressed Date deferred Reason deferred Estimated date of resolution  Previous SI      anxiety                                                 DISCHARGE CRITERIA:  Improved stabilization in mood, thinking, and/or behavior Need for constant or close observation no longer present Reduction of life-threatening or endangering symptoms to within safe limits Verbal commitment to aftercare and medication compliance  PRELIMINARY DISCHARGE PLAN: Attend aftercare/continuing care group Outpatient therapy Participate in family therapy  PATIENT/FAMIILY INVOLVEMENT: This treatment plan has been presented to and reviewed with the patient, Phillip Anderson, and/or family member,  The patient and family have been given the opportunity to ask questions and make suggestions.  Noah Charon 03/24/2012, 11:16 PM
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
## 2012-03-24 NOTE — ED Notes (Addendum)
Spoke with [facility] to verify bed availability. After they review the labs they will let us know if pt is accepted or not. Dr Rubin Payor updated

## 2012-03-24 NOTE — ED Provider Notes (Signed)
History     CSN: [id number]  Arrival date & time 03/24/12  1753   First MD Initiated Contact with Patient 03/24/12 1842      Chief Complaint  Patient presents with  . Medical Clearance    (Consider location/radiation/quality/duration/timing/severity/associated sxs/prior treatment) The history is provided by the patient.   patient is here for medical clearance. He reportedly was at behavioral [facility] hospital and was sent to [facility]. He states he had some depression issues to deal with. He has no previous depression history. He reportedly has a bed at behavioral health. He states he will smoke cigarettes and occasional marijuana. He denies other drug use. He denies suicidal thoughts. The homicidal thoughts. No chest pain abdominal pain no headaches. He states he's otherwise healthy.  Past Medical History  Diagnosis Date  . No pertinent past medical history     No past surgical history on file.  No family history on file.  History  Substance Use Topics  . Smoking status: Current Everyday Smoker -- 1.0 packs/day  . Smokeless tobacco: Not on file  . Alcohol Use: Yes      Review of Systems  Constitutional: Negative for activity change and appetite change.  HENT: Negative for neck stiffness.   Eyes: Negative for pain.  Respiratory: Negative for chest tightness and shortness of breath.   Cardiovascular: Negative for chest pain and leg swelling.  Gastrointestinal: Negative for nausea, vomiting, abdominal pain and diarrhea.  Genitourinary: Negative for flank pain.  Musculoskeletal: Negative for back pain.  Skin: Negative for rash.  Neurological: Negative for weakness, numbness and headaches.  Psychiatric/Behavioral: Negative for behavioral problems and dysphoric mood.    Allergies  Review of patient's allergies indicates no known allergies.  Home Medications   Current Outpatient Rx  Name Route Sig Dispense Refill  . IBUPROFEN 200 MG PO TABS Oral Take 400 mg by mouth  every 6 (six) hours as needed. Pain       BP 130/77  Pulse 71  Temp(Src) 98.4 F (36.9 C) (Oral)  Resp 16  SpO2 99%  Physical Exam  Nursing note and vitals reviewed. Constitutional: He is oriented to person, place, and time. He appears well-developed and well-nourished.  HENT:  Head: Normocephalic and atraumatic.  Eyes: EOM are normal. Pupils are equal, round, and reactive to light.  Cardiovascular: Normal rate, regular rhythm and normal heart sounds.   No murmur heard. Pulmonary/Chest: Effort normal and breath sounds normal.  Abdominal: Soft. Bowel sounds are normal. He exhibits no distension and no mass. There is no tenderness. There is no rebound and no guarding.  Musculoskeletal: Normal range of motion. He exhibits no edema.  Neurological: He is alert and oriented to person, place, and time. No cranial nerve deficit.  Skin: Skin is warm and dry.  Psychiatric: He has a normal mood and affect.    ED Course  Procedures (including critical care time)  Labs Reviewed  COMPREHENSIVE METABOLIC PANEL - Abnormal; Notable for the following:    Glucose, Bld 124 (*)    GFR calc non Af Amer 81 (*)    All other components within normal limits  URINE RAPID DRUG SCREEN (HOSP PERFORMED) - Abnormal; Notable for the following:    Cocaine POSITIVE (*)    Tetrahydrocannabinol POSITIVE (*)    All other components within normal limits  ETHANOL  CBC  DIFFERENTIAL   No results found.   1. Depression       MDM  Depression. Accepted at [facility]. He was  cocaine positive in his urine, although he denied the use of it.         Juliet Rude. Rubin Payor, MD 03/24/12 2150

## 2012-03-25 DIAGNOSIS — F1920 Other psychoactive substance dependence, uncomplicated: Secondary | ICD-10-CM | POA: Diagnosis present

## 2012-03-25 DIAGNOSIS — F1910 Other psychoactive substance abuse, uncomplicated: Secondary | ICD-10-CM

## 2012-03-25 DIAGNOSIS — F1120 Opioid dependence, uncomplicated: Secondary | ICD-10-CM | POA: Diagnosis present

## 2012-03-25 DIAGNOSIS — F1994 Other psychoactive substance use, unspecified with psychoactive substance-induced mood disorder: Secondary | ICD-10-CM

## 2012-03-25 MED ORDER — NICOTINE 21 MG/24HR TD PT24
21.0000 mg | MEDICATED_PATCH | Freq: Every day | TRANSDERMAL | Status: DC
  Administered 2012-03-25 – 2012-03-26 (×2): 21 mg via TRANSDERMAL
  Filled 2012-03-25 (×5): qty 1

## 2012-03-25 MED ORDER — TRAZODONE HCL 100 MG PO TABS
100.0000 mg | ORAL_TABLET | Freq: Every day | ORAL | Status: DC
  Filled 2012-03-25: qty 1

## 2012-03-25 MED ORDER — DICLOFENAC EPOLAMINE 1.3 % TD PTCH
1.0000 | MEDICATED_PATCH | Freq: Two times a day (BID) | TRANSDERMAL | Status: DC
  Administered 2012-03-25: 1 via TRANSDERMAL
  Filled 2012-03-25 (×8): qty 1

## 2012-03-25 MED ORDER — GABAPENTIN 100 MG PO CAPS
100.0000 mg | ORAL_CAPSULE | Freq: Three times a day (TID) | ORAL | Status: AC
  Administered 2012-03-25 – 2012-03-26 (×2): 100 mg via ORAL
  Filled 2012-03-25 (×3): qty 1

## 2012-03-25 MED ORDER — AMITRIPTYLINE HCL 25 MG PO TABS
25.0000 mg | ORAL_TABLET | Freq: Every day | ORAL | Status: DC
  Administered 2012-03-25: 25 mg via ORAL
  Filled 2012-03-25 (×4): qty 1

## 2012-03-25 MED ORDER — TRAZODONE HCL 100 MG PO TABS: 100.0000 mg | ORAL_TABLET | Freq: Every day | ORAL | Status: DC

## 2012-03-25 MED ORDER — MAGNESIUM HYDROXIDE 400 MG/5ML PO SUSP: 30.0000 mL | Freq: Every day | ORAL | Status: DC | PRN

## 2012-03-25 MED ORDER — CITALOPRAM HYDROBROMIDE 20 MG PO TABS
20.0000 mg | ORAL_TABLET | Freq: Every day | ORAL | Status: DC
  Administered 2012-03-25 – 2012-03-26 (×2): 20 mg via ORAL
  Filled 2012-03-25 (×6): qty 1

## 2012-03-25 MED ORDER — GABAPENTIN 300 MG PO CAPS
300.0000 mg | ORAL_CAPSULE | Freq: Three times a day (TID) | ORAL | Status: DC
  Administered 2012-03-26: 300 mg via ORAL
  Filled 2012-03-25 (×2): qty 1

## 2012-03-25 MED ORDER — TRAZODONE HCL 100 MG PO TABS
100.0000 mg | ORAL_TABLET | Freq: Every evening | ORAL | Status: DC | PRN
  Filled 2012-03-25: qty 14

## 2012-03-25 MED ORDER — ACETAMINOPHEN 325 MG PO TABS: 650.0000 mg | ORAL_TABLET | Freq: Four times a day (QID) | ORAL | Status: DC | PRN

## 2012-03-25 MED ORDER — ALUM & MAG HYDROXIDE-SIMETH 200-200-20 MG/5ML PO SUSP: 30.0000 mL | ORAL | Status: DC | PRN

## 2012-03-25 NOTE — Progress Notes (Signed)
Pt. Quiet & guarded.Participating in all unit activities.Denies SI,HI, & AVH.Continues on 15 minute checks. Pt. safetr maintained.

## 2012-03-25 NOTE — H&P (Signed)
Medical/psychiatric screening examination/treatment/procedure(s) were performed by non-physician practitioner and as supervising physician I was immediately available for consultation/collaboration.  I have seen and examined this patient and agree the major elements of this evaluation.  
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
## 2012-03-25 NOTE — Tx Team (Signed)
Interdisciplinary Treatment Plan Update (Adult)  Date:  03/25/2012  Time Reviewed:  10:29 AM   Progress in Treatment: Attending groups: Yes Participating in groups:  Yes Taking medication as prescribed:  Yes Tolerating medication: Yes Family/Significant othe contact made:  Requesting consent to contact wife Patient understands diagnosis: Yes Discussing patient identified problems/goals with staff:  Yes Medical problems stabilized or resolved: Yes Denies suicidal/homicidal ideation: Yes Issues/concerns per patient self-inventory:  No  Other:  New problem(s) identified: None  Reason for Continuation of Hospitalization: Anxiety Medication Stabilization  Interventions implemented related to continuation of hospitalization:  Medication stabilization, safety checks q 15 mins, group attendance  Additional comments: Phillip Anderson denied a suicide attempt - reports that wife made that report to get him some help  Estimated length of stay: 3-5 days  Discharge Plan: Discharge home with sister and brother-in-law  New goal(s):  Review of initial/current patient goals per problem list:   1.  Goal(s): Decrease depressive symptoms to a rating of 4 or less  Met:  Yes  Target date: by discharge  As evidenced by: Phillip Anderson rates depression at 4 today  2.  Goal (s): Decrease anxiety symptoms to a rating of 4 or less  Met:  No  Target date: by discharge  As evidenced by: Phillip Anderson rates anxiety at a 5 today  3.  Goal(s): Reduce potential for suicide/self-harm  Met:  Yes  Target date: by discharge  As evidenced by: Phillip Anderson denies any suicidal thoughts  4.  Goal(s): Medication stabilization  Met:  No  Target date: by discharge  As evidenced by: Phillip Anderson has just begun to take medications  Attendees: Patient:   03/25/2012 10:29 AM  Family:     Physician:  Dr Orson Aloe, MD 03/25/2012 10:29 AM  Nursing:      Case Manager:  Juline Patch, LCSW 03/25/2012 10:29 AM  Counselor:  Angus Palms, LCSW 03/25/2012 10:29 AM  Other:  Reyes Ivan, LCSWA 03/25/2012 10:29 AM  Other:     Other:     Other:      Scribe for Treatment Team:   Billie Lade, 03/25/2012 10:29 AM

## 2012-03-25 NOTE — Progress Notes (Signed)
[facility] Adult Inpatient Family/Significant Other Suicide Prevention Education  Suicide Prevention Education:  Education Completed; Phillip Anderson, father [phone]), has been identified by the patient as the family member/significant other with whom the patient will be residing, and identified as the person(s) who will aid the patient in the event of a mental health crisis (suicidal ideations/suicide attempt).  With written consent from the patient, the family member/significant other has been provided the following suicide prevention education, prior to the and/or following the discharge of the patient.  The suicide prevention education provided includes the following:  Suicide risk factors  Suicide prevention and interventions  National Suicide Hotline telephone number  [facility] assessment telephone number  [facility] Emergency Assistance 911  [facility] and/or Residential Mobile Crisis Unit telephone number  Request made of family/significant other to:  Remove weapons (e.g., guns, rifles, knives), all items previously/currently identified as safety concern.    Remove drugs/medications (over-the-counter, prescriptions, illicit drugs), all items previously/currently identified as a safety concern.  Counselor requested consent to speak with estranged wife, as Phillip Anderson initially reported that she made up the story about him taking 45 pills to get him treatment at [facility], and indicated that she would be able to explain that. However, when counselor asked Phillip Anderson to sign consent to contact, he stated that he does not want his wife called, as he believes that she took out papers in order to set him up and to use this against him in their divorce. He consented, instead for his father to be called.  Father stated that he has never known Phillip Anderson to attempt suicide and does not know whether he actually took any pills, but he has known Phillip Anderson to threaten to hurt or kill himself when he is angry.  He would like Phillip Anderson to get help, stating that the main problem he sees is the pain pills Phillip Anderson takes. (See Family Contact note written by this counselor) Father verbalized understanding of suicide prevention information and had no further questions. He stated that Phillip Anderson has always been around firearms, but that currently he has pawned all of his guns.  Phillip Anderson 03/25/2012, 1:28 PM
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
## 2012-03-25 NOTE — [facility] Suicide Risk Assessment (Signed)
Suicide Risk Assessment  Admission Assessment     Demographic factors:  Assessment Details Time of Assessment: Admission Information Obtained From: Patient Current Mental Status:  Current Mental Status:  (denies) Loss Factors:  Loss Factors: Decrease in vocational status;Loss of significant relationship;Financial problems / change in socioeconomic status Historical Factors:  Historical Factors:  (denies) Risk Reduction Factors:  Risk Reduction Factors: Responsible for children under 52 years of age;Sense of responsibility to family;Living with another person, especially a relative;Positive therapeutic relationship  CLINICAL FACTORS:   Severe Anxiety and/or Agitation Depression:   Anhedonia Comorbid alcohol abuse/dependence Hopelessness Alcohol/Substance Abuse/Dependencies Chronic Pain Previous Psychiatric Diagnoses and Treatments  COGNITIVE FEATURES THAT CONTRIBUTE TO RISK:  Closed-mindedness Polarized thinking Thought constriction (tunnel vision)    SUICIDE RISK:   Moderate:  Frequent suicidal ideation with limited intensity, and duration, some specificity in terms of plans, no associated intent, good self-control, limited dysphoria/symptomatology, some risk factors present, and identifiable protective factors, including available and accessible social support.  Reason for hospitalization: . Diagnosis:  Axis I: Substance Induced Mood Disorder and Polysubstance abuse  ADL's:  Intact  Sleep: Poor  Appetite:  Fair  Suicidal Ideation:  Pt denies suicidal thoughts. Homicidal Ideation:  Denies adamantly any homicidal thoughts.  Mental Status Examination/Evaluation: Objective:  Appearance: Casual  Eye Contact::  Good  Speech:  Clear and Coherent  Volume:  Normal  Mood:  Anxious and Dysphoric  Affect:  Congruent  Thought Process:  Goal Directed, towards getting released today.  Orientation:  Full  Thought Content:  WDL  Suicidal Thoughts:  No  Homicidal Thoughts:  No    Memory:  Immediate;   Fair  Judgement:  Impaired  Insight:  Lacking  Psychomotor Activity:  Normal  Concentration:  Fair  Recall:  Fair  Akathisia:  No  Handed:  Right  AIMS (if indicated):     Assets:  Communication Skills Desire for Improvement  Sleep:  Number of Hours: 5.5    Vital Signs:Blood pressure 131/76, pulse 81, temperature 98.4 F (36.9 C), temperature source Oral, resp. rate 16, height 5\' 7"  (1.702 m), weight 101.606 kg (224 lb). Current Medications: Current Facility-Administered Medications  Medication Dose Route Frequency Provider Last Rate Last Dose  . acetaminophen (TYLENOL) tablet 650 mg  650 mg Oral Q6H PRN Viviann Spare, FNP      . alum & mag hydroxide-simeth (MAALOX/MYLANTA) 200-200-20 MG/5ML suspension 30 mL  30 mL Oral Q4H PRN Viviann Spare, FNP      . citalopram (CELEXA) tablet 20 mg  20 mg Oral Daily Sanjuana Kava, NP   20 mg at 03/25/12 1300  . gabapentin (NEURONTIN) capsule 100 mg  100 mg Oral TID Mike Craze, MD       Followed by  . gabapentin (NEURONTIN) capsule 300 mg  300 mg Oral TID Mike Craze, MD      . magnesium hydroxide (MILK OF MAGNESIA) suspension 30 mL  30 mL Oral Daily PRN Viviann Spare, FNP      . nicotine (NICODERM CQ - dosed in mg/24 hours) patch 21 mg  21 mg Transdermal Daily Sanjuana Kava, NP   21 mg at 03/25/12 1300  . traZODone (DESYREL) tablet 100 mg  100 mg Oral QHS PRN Viviann Spare, FNP      . DISCONTD: traZODone (DESYREL) tablet 100 mg  100 mg Oral QHS Viviann Spare, FNP      . DISCONTD: traZODone (DESYREL) tablet 100 mg  100 mg Oral  QHS Viviann Spare, FNP        Lab Results:  Results for orders placed during the hospital encounter of 03/24/12 (from the past 48 hour(s))  URINE RAPID DRUG SCREEN (HOSP PERFORMED)     Status: Abnormal   Collection Time   03/24/12  6:08 PM      Component Value Range Comment   Opiates NONE DETECTED  NONE DETECTED     Cocaine POSITIVE (*) NONE DETECTED     Benzodiazepines  NONE DETECTED  NONE DETECTED     Amphetamines NONE DETECTED  NONE DETECTED     Tetrahydrocannabinol POSITIVE (*) NONE DETECTED     Barbiturates NONE DETECTED  NONE DETECTED    ETHANOL     Status: Normal   Collection Time   03/24/12  6:20 PM      Component Value Range Comment   Alcohol, Ethyl (B) <11  0 - 11 (mg/dL)   COMPREHENSIVE METABOLIC PANEL     Status: Abnormal   Collection Time   03/24/12  6:20 PM      Component Value Range Comment   Sodium 141  135 - 145 (mEq/L)    Potassium 4.0  3.5 - 5.1 (mEq/L)    Chloride 105  96 - 112 (mEq/L)    CO2 27  19 - 32 (mEq/L)    Glucose, Bld 124 (*) 70 - 99 (mg/dL)    BUN 7  6 - 23 (mg/dL)    Creatinine, Ser 1.61  0.50 - 1.35 (mg/dL)    Calcium 9.2  8.4 - 10.5 (mg/dL)    Total Protein 7.4  6.0 - 8.3 (g/dL)    Albumin 4.3  3.5 - 5.2 (g/dL)    AST 11  0 - 37 (U/L)    ALT 9  0 - 53 (U/L)    Alkaline Phosphatase 76  39 - 117 (U/L)    Total Bilirubin 0.6  0.3 - 1.2 (mg/dL)    GFR calc non Af Amer 81 (*) >90 (mL/min)    GFR calc Af Amer >90  >90 (mL/min)   CBC     Status: Normal   Collection Time   03/24/12  6:20 PM      Component Value Range Comment   WBC 6.1  4.0 - 10.5 (K/uL)    RBC 5.04  4.22 - 5.81 (MIL/uL)    Hemoglobin 15.9  13.0 - 17.0 (g/dL)    HCT 09.6  04.5 - 40.9 (%)    MCV 91.7  78.0 - 100.0 (fL)    MCH 31.5  26.0 - 34.0 (pg)    MCHC 34.4  30.0 - 36.0 (g/dL)    RDW 81.1  91.4 - 78.2 (%)    Platelets 181  150 - 400 (K/uL)   DIFFERENTIAL     Status: Normal   Collection Time   03/24/12  6:20 PM      Component Value Range Comment   Neutrophils Relative 75  43 - 77 (%)    Neutro Abs 4.6  1.7 - 7.7 (K/uL)    Lymphocytes Relative 17  12 - 46 (%)    Lymphs Abs 1.0  0.7 - 4.0 (K/uL)    Monocytes Relative 7  3 - 12 (%)    Monocytes Absolute 0.4  0.1 - 1.0 (K/uL)    Eosinophils Relative 1  0 - 5 (%)    Eosinophils Absolute 0.1  0.0 - 0.7 (K/uL)    Basophils Relative 0  0 - 1 (%)  Basophils Absolute 0.0  0.0 - 0.1 (K/uL)      Physical Findings: AIMS:  , ,  ,  ,    CIWA:    COWS:     Risk: Risk of harm to self is elevated by his anxiety, chronic pain that he refuses medications other than opiates for and his addictions.    Risk of harm to others is slightly elevated by his past involvement in aggression, but none since age 81.  Treatment Plan Summary: Daily contact with patient to assess and evaluate symptoms and progress in treatment Medication management No signs of detox, mood and anxiety less than 3/10 where 1 is the best and 10 is the worst  Plan: Admit, start Celexa for depression and Neurontin for anxiety and pain management.  Offer Elavil for pain management as well as Flector patches. We will continue on q. 15 checks the unit protocol. At this time there is no clinical indication for one-to-one observation as patient contract for safety and presents little risk to harm themself and others.  We will increase collateral information. I encourage patient to participate in group milieu therapy. Pt will be seen in treatment team soon for further treatment and appropriate discharge planning. Please see history and physical note for more detailed information ELOS: 3 to 5 days.   Kelani Robart 03/25/2012, 4:15 PM

## 2012-03-25 NOTE — Progress Notes (Signed)
Patient ID: Phillip Anderson, male   DOB: Jul 24, 1981, 31 y.o.   MRN: [id number] Pt. In bed resting, no distress noted. Resp. Even and unlabored. Staff will monitor q77min for safety.

## 2012-03-25 NOTE — Progress Notes (Signed)
[facility] MD Progress Note  03/25/2012 4:10 PM  Diagnosis:  Axis I: Substance Induced Mood Disorder and Polysubstance abuse  ADL's:  Intact  Sleep: Poor  Appetite:  Fair  Suicidal Ideation:  Pt denies suicidal thoughts. Homicidal Ideation:  Denies adamantly any homicidal thoughts.  Mental Status Examination/Evaluation: Objective:  Appearance: Casual  Eye Contact::  Good  Speech:  Clear and Coherent  Volume:  Normal  Mood:  Anxious and Dysphoric  Affect:  Congruent  Thought Process:  Goal Directed, towards getting released today.  Orientation:  Full  Thought Content:  WDL  Suicidal Thoughts:  No  Homicidal Thoughts:  No  Memory:  Immediate;   Fair  Judgement:  Impaired  Insight:  Lacking  Psychomotor Activity:  Normal  Concentration:  Fair  Recall:  Fair  Akathisia:  No  Handed:  Right  AIMS (if indicated):     Assets:  Communication Skills Desire for Improvement  Sleep:  Number of Hours: 5.5    Vital Signs:Blood pressure 131/76, pulse 81, temperature 98.4 F (36.9 C), temperature source Oral, resp. rate 16, height 5\' 7"  (1.702 m), weight 101.606 kg (224 lb). Current Medications: Current Facility-Administered Medications  Medication Dose Route Frequency Provider Last Rate Last Dose  . acetaminophen (TYLENOL) tablet 650 mg  650 mg Oral Q6H PRN Viviann Spare, FNP      . alum & mag hydroxide-simeth (MAALOX/MYLANTA) 200-200-20 MG/5ML suspension 30 mL  30 mL Oral Q4H PRN Viviann Spare, FNP      . citalopram (CELEXA) tablet 20 mg  20 mg Oral Daily Sanjuana Kava, NP   20 mg at 03/25/12 1300  . gabapentin (NEURONTIN) capsule 100 mg  100 mg Oral TID Mike Craze, MD       Followed by  . gabapentin (NEURONTIN) capsule 300 mg  300 mg Oral TID Mike Craze, MD      . magnesium hydroxide (MILK OF MAGNESIA) suspension 30 mL  30 mL Oral Daily PRN Viviann Spare, FNP      . nicotine (NICODERM CQ - dosed in mg/24 hours) patch 21 mg  21 mg Transdermal Daily Sanjuana Kava, NP    21 mg at 03/25/12 1300  . traZODone (DESYREL) tablet 100 mg  100 mg Oral QHS PRN Viviann Spare, FNP      . DISCONTD: traZODone (DESYREL) tablet 100 mg  100 mg Oral QHS Viviann Spare, FNP      . DISCONTD: traZODone (DESYREL) tablet 100 mg  100 mg Oral QHS Viviann Spare, FNP        Lab Results:  Results for orders placed during the hospital encounter of 03/24/12 (from the past 48 hour(s))  URINE RAPID DRUG SCREEN (HOSP PERFORMED)     Status: Abnormal   Collection Time   03/24/12  6:08 PM      Component Value Range Comment   Opiates NONE DETECTED  NONE DETECTED     Cocaine POSITIVE (*) NONE DETECTED     Benzodiazepines NONE DETECTED  NONE DETECTED     Amphetamines NONE DETECTED  NONE DETECTED     Tetrahydrocannabinol POSITIVE (*) NONE DETECTED     Barbiturates NONE DETECTED  NONE DETECTED    ETHANOL     Status: Normal   Collection Time   03/24/12  6:20 PM      Component Value Range Comment   Alcohol, Ethyl (B) <11  0 - 11 (mg/dL)   COMPREHENSIVE METABOLIC PANEL  Status: Abnormal   Collection Time   03/24/12  6:20 PM      Component Value Range Comment   Sodium 141  135 - 145 (mEq/L)    Potassium 4.0  3.5 - 5.1 (mEq/L)    Chloride 105  96 - 112 (mEq/L)    CO2 27  19 - 32 (mEq/L)    Glucose, Bld 124 (*) 70 - 99 (mg/dL)    BUN 7  6 - 23 (mg/dL)    Creatinine, Ser 5.40  0.50 - 1.35 (mg/dL)    Calcium 9.2  8.4 - 10.5 (mg/dL)    Total Protein 7.4  6.0 - 8.3 (g/dL)    Albumin 4.3  3.5 - 5.2 (g/dL)    AST 11  0 - 37 (U/L)    ALT 9  0 - 53 (U/L)    Alkaline Phosphatase 76  39 - 117 (U/L)    Total Bilirubin 0.6  0.3 - 1.2 (mg/dL)    GFR calc non Af Amer 81 (*) >90 (mL/min)    GFR calc Af Amer >90  >90 (mL/min)   CBC     Status: Normal   Collection Time   03/24/12  6:20 PM      Component Value Range Comment   WBC 6.1  4.0 - 10.5 (K/uL)    RBC 5.04  4.22 - 5.81 (MIL/uL)    Hemoglobin 15.9  13.0 - 17.0 (g/dL)    HCT 98.1  19.1 - 47.8 (%)    MCV 91.7  78.0 - 100.0 (fL)     MCH 31.5  26.0 - 34.0 (pg)    MCHC 34.4  30.0 - 36.0 (g/dL)    RDW 29.5  62.1 - 30.8 (%)    Platelets 181  150 - 400 (K/uL)   DIFFERENTIAL     Status: Normal   Collection Time   03/24/12  6:20 PM      Component Value Range Comment   Neutrophils Relative 75  43 - 77 (%)    Neutro Abs 4.6  1.7 - 7.7 (K/uL)    Lymphocytes Relative 17  12 - 46 (%)    Lymphs Abs 1.0  0.7 - 4.0 (K/uL)    Monocytes Relative 7  3 - 12 (%)    Monocytes Absolute 0.4  0.1 - 1.0 (K/uL)    Eosinophils Relative 1  0 - 5 (%)    Eosinophils Absolute 0.1  0.0 - 0.7 (K/uL)    Basophils Relative 0  0 - 1 (%)    Basophils Absolute 0.0  0.0 - 0.1 (K/uL)     Physical Findings: AIMS:  , ,  ,  ,    CIWA:    COWS:     Treatment Plan Summary: Daily contact with patient to assess and evaluate symptoms and progress in treatment Medication management No signs of detox, mood and anxiety less than 3/10 where 1 is the best and 10 is the worst  Plan: Admit, start Celexa for depression and Neurontin for anxiety.  Icess Bertoni 03/25/2012, 4:10 PM

## 2012-03-25 NOTE — Progress Notes (Signed)
Grief and Loss Group  Co-facilitated grief and loss group w/ counseling intern Corliss Marcus, MA, MEd, LPCA, NCC for pts on [facility] [address]. Collaborated w/ group to establish group ground rules and norms. Introduced topic of grief/loss, group primarily focused on losses in relationships and building walls around themselves to protect themselves. Group was active and engaged w/ open sharing and occ support. Group members engaged in a group activity of selecting a picture from among a group of pictures that represents what they want an ideal relationship to look like.  Pt was progressively engaged in group. Pt shared that he was going through separation/divorce after 15y of marriage and doesn't believe in a perfect relationship, viewed the pictures for the group activity but did not see a picture that represented an ideal relationship for him.  Evanie Buckle B MS, LPCA, NCC

## 2012-03-25 NOTE — Progress Notes (Signed)
[facility] Group Notes: (Counselor/Nursing/MHT/Case Management/Adjunct) 03/25/2012   @ 11:00 Feelings About Diagnosis  Type of Therapy:  Group Therapy  Participation Level:  Did Not Attend   Billie Lade 03/25/2012  2:57 PM

## 2012-03-25 NOTE — Progress Notes (Signed)
Patient seen during d/c planning group. He reports being admitted to the hospital due to wife reporting he OD'd.  Patient advised that he has been having problems with anxiety and panic due to break up of marriage and recent job lost.  Patient stated he wanted to get medication for panic attacks and was sent to [facility].  He stated he is not suicidal at this time and has not been but wife made the report in a effort to help him get medication. Patient reports having home and transportation.  He will need assistance with medications.  Patient rates depression at four and anxiety at five.

## 2012-03-25 NOTE — H&P (Signed)
Psychiatric Admission Assessment Adult  Patient Identification:  Phillip Anderson  Date of Evaluation:  03/25/2012  Chief Complaint:  Mood Disorder NOS  History of Present Illness:: This is a 31 year old African-American male, admitted from the [facility] ED where he was medically cleared for admission to this [facility]. The report indicated that patient was seen at [facility] for taking 3 kinds of pills, not specified, in an attempt to die. Patient was recently kicked out of the house by wife, lost his house and marriage, his 3 children living with wife. He also lost his job last week, currently living with sister. Patient reports, "I lost my job last Wednesday, now my wife is trying to divorce me. It got to me that I am about to lose all that mattered to me. I started to think about all that is going on in my life, then my chest started hurting real bad, it felt like I could not breath on my own, I panicked. I voluntarily came to this hospital for help because I could not make out what was going on with me. Then, I was sent to [facility]. When I got to [facility], my wife told them that I ate bunch of pills to kill myself. One thing led to the other. The next thing I knew I was shackled like a criminal and brought down here. My wife said all these things may be to have something on me to take to court with her because she is trying to divorce me. Why do I want to die? I am not depressed or thinking about dying. I did not at any time threaten and or wish to die. All I want now is to smoke me a cigarette".   Mood Symptoms:  Patient denies report Depression Symptoms:  Patient denies report  (Hypo) Manic Symptoms:  Irritable Mood, because of what is going on right now.  Anxiety Symptoms:  Excessive Worry, Panic Symptoms,  Psychotic Symptoms:  Hallucinations: None  PTSD Symptoms: Had a traumatic exposure:  None reported  Past Psychiatric History: Diagnosis:  PolySubstance abuse  Hospitalizations: [facility]  Outpatient Care: "I don't have one"  Substance Abuse Care: None reported  Self-Mutilation: Denies   Suicidal Attempts: Denies   Violent Behaviors: Denies    Past Medical History:   Past Medical History  Diagnosis Date  . No pertinent past medical history      Allergies:   Allergies  Allergen Reactions  . Bee Venom    PTA Medications: Prescriptions prior to admission  Medication Sig Dispense Refill  . ibuprofen (ADVIL,MOTRIN) 200 MG tablet Take 400 mg by mouth every 6 (six) hours as needed. Pain           Substance Abuse History in the last 12 months: Substance Age of 1st Use Last Use Amount Specific Type  Nicotine 15 Prior to hosp 1 pack daily Cigarettes  Alcohol Denies use     Cannabis 16 Prior to hosp Occasionally Marijuana  Opiates Denies use     Cocaine 18 Prior to hosp occasionally Crack cocaine  Methamphetamines Denies use     LSD Denies use     Ecstasy Denies use     Benzodiazepines Denies use     Caffeine      Inhalants      Others:                         Consequences of Substance  Abuse: Medical Consequences:  Liver damage Legal Consequences:  Arrests, jail time Family Consequences:  Family discord  Social History: Current Place of Residence:  [place]  Place of Birth:  [place]  Family Members: "My wife and kids  Marital Status:  Married  Children: 3  Sons:1  Daughters:2  Relationships: "I'm married"  Education:  No high school diploma  Educational Problems/Performance:  Religious Beliefs/Practices:  History of Abuse (Emotional/Phsycial/Sexual): I did not finish high school"  Occupational Experiences: "I recently lost my job just last weekActuary History:  None.  Legal History: None reported  Hobbies/Interests: None reported  Family History:  No family history on file.  Mental Status Examination/Evaluation: Objective:  Appearance: Casual  Eye Contact::  Good  Speech:   Clear and Coherent  Volume:  Normal  Mood:  Euthymic  Affect:  Appropriate  Thought Process:  Coherent and Intact  Orientation:  Full  Thought Content:  Rumination  Suicidal Thoughts:  No  Homicidal Thoughts:  No  Memory:  Immediate;   Good Recent;   Good Remote;   Good  Judgement:  Fair  Insight:  Fair  Psychomotor Activity:  Normal  Concentration:  Good  Recall:  Good  Akathisia:  No  Handed:  Right  AIMS (if indicated):     Assets:  Desire for Improvement  Sleep:  Number of Hours: 5.5     Laboratory/X-Ray Psychological Evaluation(s)      Assessment:    AXIS I:  Substance Abuse AXIS II:  Deferred AXIS III:   Past Medical History  Diagnosis Date  . No pertinent past medical history    AXIS IV:  economic problems, other psychosocial or environmental problems and Marital issues AXIS V:  11-20 some danger of hurting self or others possible OR occasionally fails to maintain minimal personal hygiene OR gross impairment in communication  Treatment Plan/Recommendations: Admit for safety and stabilization Review and reinstate any pertinent home medications for other medical conditions. Citalopram 20 mg daily for anxiety Nicotine patch for nicotine withdrawal symptoms.  Treatment Plan Summary: Daily contact with patient to assess and evaluate symptoms and progress in treatment Medication management  Current Medications:  Current Facility-Administered Medications  Medication Dose Route Frequency Provider Last Rate Last Dose  . acetaminophen (TYLENOL) tablet 650 mg  650 mg Oral Q6H PRN Viviann Spare, FNP      . alum & mag hydroxide-simeth (MAALOX/MYLANTA) 200-200-20 MG/5ML suspension 30 mL  30 mL Oral Q4H PRN Viviann Spare, FNP      . magnesium hydroxide (MILK OF MAGNESIA) suspension 30 mL  30 mL Oral Daily PRN Viviann Spare, FNP      . traZODone (DESYREL) tablet 100 mg  100 mg Oral QHS PRN Viviann Spare, FNP      . DISCONTD: traZODone (DESYREL) tablet 100 mg   100 mg Oral QHS Viviann Spare, FNP      . DISCONTD: traZODone (DESYREL) tablet 100 mg  100 mg Oral QHS Viviann Spare, FNP        Observation Level/Precautions:  Q 15 minutes checks for safety  Laboratory:  Review and reinstate any pertinent home medications  Psychotherapy:  Group  Medications:  See medications lists  Routine PRN Medications:  Yes  Consultations: None indicated   Discharge Concerns:  Safety  Other:     Armandina Stammer I 5/28/201311:27 AM

## 2012-03-26 MED ORDER — GABAPENTIN 300 MG PO CAPS: 300.0000 mg | ORAL_CAPSULE | Freq: Three times a day (TID) | ORAL | Status: DC

## 2012-03-26 MED ORDER — TRAZODONE HCL 100 MG PO TABS: 100.0000 mg | ORAL_TABLET | Freq: Every evening | ORAL | Status: DC | PRN

## 2012-03-26 MED ORDER — DICLOFENAC EPOLAMINE 1.3 % TD PTCH: 1.0000 | MEDICATED_PATCH | Freq: Two times a day (BID) | TRANSDERMAL | Status: DC

## 2012-03-26 MED ORDER — AMITRIPTYLINE HCL 25 MG PO TABS: 25.0000 mg | ORAL_TABLET | Freq: Every day | ORAL | Status: DC

## 2012-03-26 MED ORDER — NICOTINE 21 MG/24HR TD PT24: 1.0000 | MEDICATED_PATCH | Freq: Every day | TRANSDERMAL | Status: AC

## 2012-03-26 MED ORDER — CITALOPRAM HYDROBROMIDE 20 MG PO TABS: 20.0000 mg | ORAL_TABLET | Freq: Every day | ORAL | Status: DC

## 2012-03-26 NOTE — Progress Notes (Signed)
D: Pt is slightly irritable; states that his depression and hopelessness are a "1"; pt states that he needs to get out of the facility in order "to provide for his children" A: Pt given emotional support from staff, medication routine continued R: Pt remains cooperative with staff; states that he's "ready to leave"

## 2012-03-26 NOTE — Progress Notes (Signed)
[facility] Group Notes:  (Counselor/Nursing/MHT/Case Management/Adjunct)  03/26/2012  11:00am Emotion Regulation  Type of Therapy:  Group Therapy  Participation Level:  Did Not Attend    Phillip Anderson 03/26/2012  3:01 PM       [facility] Group Notes: (Counselor/Nursing/MHT/Case Management/Adjunct) 03/26/2012   @1 :15pm Breathing & Meditation for Anxiety/Anger   Type of Therapy:  Group Therapy  Participation Level:  None  Participation Quality:  None  Affect:  Blunted  Cognitive:  Appropriate  Insight:  None  Engagement in Group: None  Engagement in Therapy: None    Modes of Intervention:  Support and Exploration  Summary of Progress/Problems: Phillip Anderson did not engage in focused breathing and muscle relaxation. He watched others participate  Phillip Anderson 03/26/2012 3:01 PM

## 2012-03-26 NOTE — Progress Notes (Signed)
Pt. Denies SI,HI,& AVH. PT. Was given his belongings. Pt. Verbalized understanding of all d/c instructions. D/C,d home.

## 2012-03-26 NOTE — Progress Notes (Signed)
03/26/2012         Time: 1415      Group Topic/Focus: The focus of this group is on enhancing the patient's understanding of leisure, barriers to leisure, and the importance of engaging in positive leisure activities upon discharge for improved total health.  Participation Level: Active  Participation Quality: Appropriate and Attentive  Affect: Excited  Cognitive: Oriented   Additional Comments: Patient able to identify positive leisure activities to engage in upon discharge.   Tabrina Esty 03/26/2012 3:55 PM

## 2012-03-26 NOTE — Progress Notes (Signed)
Patient resting quietly in bed, no distress noted. Q15 minute checks for safety maintained. Will continue to monitor.  

## 2012-03-26 NOTE — Discharge Instructions (Addendum)
For what could be brain effects from drugs, it advised that you get  regular exercise, regular sleep, and  consume good quality, fish oil, 1000 mg twice a day. These 3 things are the foundation of rehabilitating your brain. If memory is a problem then INSTEAD of the fish oil mentioned above, try using Brain Power Basics from [employer].  You can order online or by phone [phone]. It costs $99 for the first month, and $80 monthly thereafter, but that investment in your brain and the recovery of your brain proper functioning would seem worth it.  Attend 90 meetings in 90 days. Get trusted sponsor from the advise of others or from whomever in meetings seems to make sense, has a proven track record, will hold you responsible for your sobriety, and both expects and insists on total abstinence.  Work the steps HONESTLY with the trusted sponsor. Get obsessed with your recovery by often reminding yourself of how DEADLY this dredged horrible disease of addiction JUST IS. Focus the first month on speaker meetings where you specifically look at how your life has been wrecked by drugs/alcohol and how your life has been similar to that of the speakers.  For anyone anticipating a relationship with you I would strongly consider attending at least 6 Alanon Meetings to help you learn about how your helping others to the exclusion of helping yourself is actually hurting yourself and is actually an addiction to fixing others and that you need to work the 12 Step to Happiness through the [employer]. Al-Anon Family Groups could be helpful with how to deal with substance abusing family and friends. Or your own issues of being in victim role.  There are only 40 Alanon Family Group meetings a week here in [place].  Online are current listing of those meetings @ [email or web address]  There are [employer].  Search on line and there you can learn the format and can access the schedule  for yourself.  They ask you to temproarily block call waiting by starting with *70 then their number is [phone]  You might consider using Stevia as a sweetener to avoid any further brain trouble.  Diabetes is much more than blood sugar control.  The out of whack blood sugar actually leads to blood vessel troubles like heart attacks, strokes, blindness and kidney troubles.  Be sure to follow up on this and keep in close contact with your doctor about this.  Family services of the [place] might be able to help you with your health care needs.

## 2012-03-26 NOTE — Progress Notes (Signed)
[facility] MD Progress Note  03/26/2012 4:02 PM  Diagnosis:  Axis I: Substance Induced Mood Disorder and Polysubstance abuse  ADL's:  Intact  Sleep: Good  Appetite:  Good  Suicidal Ideation:  Pt denies suicidal thoughts.   His father had told a staff member that when he gets mad that he threatens suicide, but has made no efforts to attempt to kill himself. Homicidal Ideation:  Denies adamantly any homicidal thoughts.  Mental Status Examination/Evaluation: Objective:  Appearance: Casual  Eye Contact::  Good  Speech:  Clear and Coherent  Volume:  Normal  Mood:  Euthymic  Affect:  Congruent  Thought Process:  Coherent,   Orientation:  Full  Thought Content:  WDL  Suicidal Thoughts:  No  Homicidal Thoughts:  No  Memory:  Immediate;   Fair  Judgement:  Fair  Insight:  Fair  Psychomotor Activity:  Normal  Concentration:  Fair  Recall:  Fair  Akathisia:  No  Handed:  Right  AIMS (if indicated):     Assets:  Communication Skills Desire for Improvement  Sleep:  Number of Hours: 6.75    Vital Signs:Blood pressure 106/69, pulse 67, temperature 97.9 F (36.6 C), temperature source Oral, resp. rate 16, height 5\' 7"  (1.702 m), weight 101.606 kg (224 lb). Current Medications: Current Facility-Administered Medications  Medication Dose Route Frequency Provider Last Rate Last Dose  . acetaminophen (TYLENOL) tablet 650 mg  650 mg Oral Q6H PRN Viviann Spare, FNP      . alum & mag hydroxide-simeth (MAALOX/MYLANTA) 200-200-20 MG/5ML suspension 30 mL  30 mL Oral Q4H PRN Viviann Spare, FNP      . amitriptyline (ELAVIL) tablet 25 mg  25 mg Oral QHS Mike Craze, MD   25 mg at 03/25/12 2213  . citalopram (CELEXA) tablet 20 mg  20 mg Oral Daily Sanjuana Kava, NP   20 mg at 03/26/12 0916  . diclofenac (FLECTOR) 1.3 % 1 patch  1 patch Transdermal BID Mike Craze, MD   1 patch at 03/25/12 1842  . gabapentin (NEURONTIN) capsule 100 mg  100 mg Oral TID Mike Craze, MD   100 mg at 03/26/12  5784   Followed by  . gabapentin (NEURONTIN) capsule 300 mg  300 mg Oral TID Mike Craze, MD   300 mg at 03/26/12 1300  . magnesium hydroxide (MILK OF MAGNESIA) suspension 30 mL  30 mL Oral Daily PRN Viviann Spare, FNP      . nicotine (NICODERM CQ - dosed in mg/24 hours) patch 21 mg  21 mg Transdermal Daily Sanjuana Kava, NP   21 mg at 03/26/12 0916  . traZODone (DESYREL) tablet 100 mg  100 mg Oral QHS PRN Viviann Spare, FNP        Lab Results:  Results for orders placed during the hospital encounter of 03/24/12 (from the past 48 hour(s))  URINE RAPID DRUG SCREEN (HOSP PERFORMED)     Status: Abnormal   Collection Time   03/24/12  6:08 PM      Component Value Range Comment   Opiates NONE DETECTED  NONE DETECTED     Cocaine POSITIVE (*) NONE DETECTED     Benzodiazepines NONE DETECTED  NONE DETECTED     Amphetamines NONE DETECTED  NONE DETECTED     Tetrahydrocannabinol POSITIVE (*) NONE DETECTED     Barbiturates NONE DETECTED  NONE DETECTED    ETHANOL     Status: Normal   Collection Time  03/24/12  6:20 PM      Component Value Range Comment   Alcohol, Ethyl (B) <11  0 - 11 (mg/dL)   COMPREHENSIVE METABOLIC PANEL     Status: Abnormal   Collection Time   03/24/12  6:20 PM      Component Value Range Comment   Sodium 141  135 - 145 (mEq/L)    Potassium 4.0  3.5 - 5.1 (mEq/L)    Chloride 105  96 - 112 (mEq/L)    CO2 27  19 - 32 (mEq/L)    Glucose, Bld 124 (*) 70 - 99 (mg/dL)    BUN 7  6 - 23 (mg/dL)    Creatinine, Ser 1.61  0.50 - 1.35 (mg/dL)    Calcium 9.2  8.4 - 10.5 (mg/dL)    Total Protein 7.4  6.0 - 8.3 (g/dL)    Albumin 4.3  3.5 - 5.2 (g/dL)    AST 11  0 - 37 (U/L)    ALT 9  0 - 53 (U/L)    Alkaline Phosphatase 76  39 - 117 (U/L)    Total Bilirubin 0.6  0.3 - 1.2 (mg/dL)    GFR calc non Af Amer 81 (*) >90 (mL/min)    GFR calc Af Amer >90  >90 (mL/min)   CBC     Status: Normal   Collection Time   03/24/12  6:20 PM      Component Value Range Comment   WBC 6.1  4.0  - 10.5 (K/uL)    RBC 5.04  4.22 - 5.81 (MIL/uL)    Hemoglobin 15.9  13.0 - 17.0 (g/dL)    HCT 09.6  04.5 - 40.9 (%)    MCV 91.7  78.0 - 100.0 (fL)    MCH 31.5  26.0 - 34.0 (pg)    MCHC 34.4  30.0 - 36.0 (g/dL)    RDW 81.1  91.4 - 78.2 (%)    Platelets 181  150 - 400 (K/uL)   DIFFERENTIAL     Status: Normal   Collection Time   03/24/12  6:20 PM      Component Value Range Comment   Neutrophils Relative 75  43 - 77 (%)    Neutro Abs 4.6  1.7 - 7.7 (K/uL)    Lymphocytes Relative 17  12 - 46 (%)    Lymphs Abs 1.0  0.7 - 4.0 (K/uL)    Monocytes Relative 7  3 - 12 (%)    Monocytes Absolute 0.4  0.1 - 1.0 (K/uL)    Eosinophils Relative 1  0 - 5 (%)    Eosinophils Absolute 0.1  0.0 - 0.7 (K/uL)    Basophils Relative 0  0 - 1 (%)    Basophils Absolute 0.0  0.0 - 0.1 (K/uL)     Physical Findings: AIMS:  , ,  ,  ,    CIWA:    COWS:     Treatment Plan Summary: Daily contact with patient to assess and evaluate symptoms and progress in treatment Medication management No signs of detox, mood and anxiety less than 3/10 where 1 is the best and 10 is the worst  Plan: "I've done messed up enough stuff.  It is time to fix things." D/C today. Will come back for samples and the list of AA mtgs in AM.  Anina Schnake 03/26/2012, 4:02 PM

## 2012-03-26 NOTE — Progress Notes (Signed)
Patient seen during d/c planning group.  He reports being much improved and hopeful to discharge home.  Patient rates all symptoms at zero and denies SI/HI.

## 2012-03-26 NOTE — [facility] Suicide Risk Assessment (Signed)
Suicide Risk Assessment  Discharge Assessment     Demographic factors:  Male;Unemployed    Current Mental Status Per Nursing Assessment::   On Admission:   (denies) At Discharge:     Current Mental Status Per Physician:  Loss Factors: Decrease in vocational status;Loss of significant relationship;Financial problems / change in socioeconomic status  Historical Factors:  (denies)  Risk Reduction Factors:      Continued Clinical Symptoms:  Alcohol/Substance Abuse/Dependencies Chronic Pain Previous Psychiatric Diagnoses and Treatments  Discharge Diagnoses:   AXIS I:  Substance Abuse, Substance Induced Mood Disorder and Nicotine Dependence AXIS II:  Deferred AXIS III:   Past Medical History  Diagnosis Date  . No pertinent past medical history    AXIS IV:  other psychosocial or environmental problems and problems with primary support group AXIS V:  61-70 mild symptoms  Cognitive Features That Contribute To Risk:  Closed-mindedness Thought constriction (tunnel vision)    Suicide Risk:  Minimal: No identifiable suicidal ideation.  Patients presenting with no risk factors but with morbid ruminations; may be classified as minimal risk based on the severity of the depressive symptoms  Diagnosis:  Axis I: Substance Induced Mood Disorder and Polysubstance abuse  ADL's:  Intact  Sleep: Good  Appetite:  Good  Suicidal Ideation:  Pt denies suicidal thoughts.   His father had told a staff member that when he gets mad that he threatens suicide, but has made no efforts to attempt to kill himself. Homicidal Ideation:  Denies adamantly any homicidal thoughts.  Mental Status Examination/Evaluation: Objective:  Appearance: Casual  Eye Contact::  Good  Speech:  Clear and Coherent  Volume:  Normal  Mood:  Euthymic  Affect:  Congruent  Thought Process:  Coherent,   Orientation:  Full  Thought Content:  WDL  Suicidal Thoughts:  No  Homicidal Thoughts:  No  Memory:   Immediate;   Fair  Judgement:  Fair  Insight:  Fair  Psychomotor Activity:  Normal  Concentration:  Fair  Recall:  Fair  Akathisia:  No  Handed:  Right  AIMS (if indicated):     Assets:  Communication Skills Desire for Improvement  Sleep:  Number of Hours: 6.75    Vital Signs:Blood pressure 106/69, pulse 67, temperature 97.9 F (36.6 C), temperature source Oral, resp. rate 16, height 5\' 7"  (1.702 m), weight 101.606 kg (224 lb). Current Medications: Current Facility-Administered Medications  Medication Dose Route Frequency Provider Last Rate Last Dose  . acetaminophen (TYLENOL) tablet 650 mg  650 mg Oral Q6H PRN Viviann Spare, FNP      . alum & mag hydroxide-simeth (MAALOX/MYLANTA) 200-200-20 MG/5ML suspension 30 mL  30 mL Oral Q4H PRN Viviann Spare, FNP      . amitriptyline (ELAVIL) tablet 25 mg  25 mg Oral QHS Mike Craze, MD   25 mg at 03/25/12 2213  . citalopram (CELEXA) tablet 20 mg  20 mg Oral Daily Sanjuana Kava, NP   20 mg at 03/26/12 0916  . diclofenac (FLECTOR) 1.3 % 1 patch  1 patch Transdermal BID Mike Craze, MD   1 patch at 03/25/12 1842  . gabapentin (NEURONTIN) capsule 100 mg  100 mg Oral TID Mike Craze, MD   100 mg at 03/26/12 5784   Followed by  . gabapentin (NEURONTIN) capsule 300 mg  300 mg Oral TID Mike Craze, MD   300 mg at 03/26/12 1300  . magnesium hydroxide (MILK OF MAGNESIA) suspension 30 mL  30  mL Oral Daily PRN Viviann Spare, FNP      . nicotine (NICODERM CQ - dosed in mg/24 hours) patch 21 mg  21 mg Transdermal Daily Sanjuana Kava, NP   21 mg at 03/26/12 0916  . traZODone (DESYREL) tablet 100 mg  100 mg Oral QHS PRN Viviann Spare, FNP        Lab Results:  Results for orders placed during the hospital encounter of 03/24/12 (from the past 72 hour(s))  URINE RAPID DRUG SCREEN (HOSP PERFORMED)     Status: Abnormal   Collection Time   03/24/12  6:08 PM      Component Value Range Comment   Opiates NONE DETECTED  NONE DETECTED      Cocaine POSITIVE (*) NONE DETECTED     Benzodiazepines NONE DETECTED  NONE DETECTED     Amphetamines NONE DETECTED  NONE DETECTED     Tetrahydrocannabinol POSITIVE (*) NONE DETECTED     Barbiturates NONE DETECTED  NONE DETECTED    ETHANOL     Status: Normal   Collection Time   03/24/12  6:20 PM      Component Value Range Comment   Alcohol, Ethyl (B) <11  0 - 11 (mg/dL)   COMPREHENSIVE METABOLIC PANEL     Status: Abnormal   Collection Time   03/24/12  6:20 PM      Component Value Range Comment   Sodium 141  135 - 145 (mEq/L)    Potassium 4.0  3.5 - 5.1 (mEq/L)    Chloride 105  96 - 112 (mEq/L)    CO2 27  19 - 32 (mEq/L)    Glucose, Bld 124 (*) 70 - 99 (mg/dL)    BUN 7  6 - 23 (mg/dL)    Creatinine, Ser 8.11  0.50 - 1.35 (mg/dL)    Calcium 9.2  8.4 - 10.5 (mg/dL)    Total Protein 7.4  6.0 - 8.3 (g/dL)    Albumin 4.3  3.5 - 5.2 (g/dL)    AST 11  0 - 37 (U/L)    ALT 9  0 - 53 (U/L)    Alkaline Phosphatase 76  39 - 117 (U/L)    Total Bilirubin 0.6  0.3 - 1.2 (mg/dL)    GFR calc non Af Amer 81 (*) >90 (mL/min)    GFR calc Af Amer >90  >90 (mL/min)   CBC     Status: Normal   Collection Time   03/24/12  6:20 PM      Component Value Range Comment   WBC 6.1  4.0 - 10.5 (K/uL)    RBC 5.04  4.22 - 5.81 (MIL/uL)    Hemoglobin 15.9  13.0 - 17.0 (g/dL)    HCT 91.4  78.2 - 95.6 (%)    MCV 91.7  78.0 - 100.0 (fL)    MCH 31.5  26.0 - 34.0 (pg)    MCHC 34.4  30.0 - 36.0 (g/dL)    RDW 21.3  08.6 - 57.8 (%)    Platelets 181  150 - 400 (K/uL)   DIFFERENTIAL     Status: Normal   Collection Time   03/24/12  6:20 PM      Component Value Range Comment   Neutrophils Relative 75  43 - 77 (%)    Neutro Abs 4.6  1.7 - 7.7 (K/uL)    Lymphocytes Relative 17  12 - 46 (%)    Lymphs Abs 1.0  0.7 - 4.0 (K/uL)  Monocytes Relative 7  3 - 12 (%)    Monocytes Absolute 0.4  0.1 - 1.0 (K/uL)    Eosinophils Relative 1  0 - 5 (%)    Eosinophils Absolute 0.1  0.0 - 0.7 (K/uL)    Basophils Relative 0  0 - 1  (%)    Basophils Absolute 0.0  0.0 - 0.1 (K/uL)     RISK REDUCTION FACTORS: What pt has learned from hospital stay is that he needed to stay and learn about how medications can help him manage his pain without opiates and how to stop smoking.  He also learned that needs to stay busy and stay positive.  Risk of self harm is elevated by his addicitons, but he vows that he will never commit suicide because he loves himself too much.  He has his kids to live for as well.  Risk of harm to others is elevated by his distant past history of aggression, but no legal charges since age 69.  PLAN: Discharge home Continue Medication List  As of 03/26/2012  4:30 PM   TAKE these medications      Indication    amitriptyline 25 MG tablet   Commonly known as: ELAVIL   Take 1 tablet (25 mg total) by mouth at bedtime. For pain management, depression, and insomnia.       citalopram 20 MG tablet   Commonly known as: CELEXA   Take 1 tablet (20 mg total) by mouth daily. For depression.       diclofenac 1.3 % Ptch   Commonly known as: FLECTOR   Place 1 patch onto the skin 2 (two) times daily. For management of pain.       gabapentin 300 MG capsule   Commonly known as: NEURONTIN   Take 1 capsule (300 mg total) by mouth 3 (three) times daily. For anxiety       ibuprofen 200 MG tablet   Commonly known as: ADVIL,MOTRIN   Take 400 mg by mouth every 6 (six) hours as needed. Pain         nicotine 21 mg/24hr patch   Commonly known as: NICODERM CQ - dosed in mg/24 hours   Place 1 patch onto the skin daily. For smoking cessation.       traZODone 100 MG tablet   Commonly known as: DESYREL   Take 1 tablet (100 mg total) by mouth at bedtime as needed for sleep.    Indication: Trouble Sleeping           Follow-up recommendations:  Activities: Resume typical activities Diet: Resume typical diet Other: Follow up with outpatient provider and report any side effects to out patient  prescriber.  Plan: "I've done messed up enough stuff.  It is time to fix things." D/C today. Will come back for samples and the list of AA mtgs in AM.  Phillip Anderson 03/26/2012, 4:15 PM

## 2012-03-27 NOTE — Progress Notes (Signed)
Patient Discharge Instructions:  After Visit Summary (AVS):   Faxed to:  03/27/2012 Psychiatric Admission Assessment Note:   Faxed to:  03/27/2012 Suicide Risk Assessment - Discharge Assessment:   Faxed to:  03/27/2012 Faxed/Sent to the Next Level Care provider:  03/27/2012  Faxed to [facility] - Dr. Barrie Folk @ [phone]  Wandra Scot, 03/27/2012, 6:32 PM

## 2012-04-01 NOTE — Discharge Summary (Signed)
Physician Discharge Summary Note  Patient:  Phillip Anderson is an 31 y.o., male MRN:  [id number] DOB:  08/31/1981 Patient phone:  [phone] (home)  Patient address:   [address] [place] [address],   Date of Admission:  03/24/2012 Date of Discharge: 03/26/2012  Reason for Admission: This is a 31 year old African-American male, admitted from the [facility] ED where he was medically cleared for admission to this [facility]. The report indicated that patient was seen at [facility] for taking 3 kinds of pills, not specified, in an attempt to die. Patient was recently kicked out of the house by wife, lost his house and marriage, his 3 children living with wife. He also lost his job last week, currently living with sister. Patient reports, "I lost my job last Wednesday, now my wife is trying to divorce me. It got to me that I am about to lose all that mattered to me. I started to think about all that is going on in my life, then my chest started hurting real bad, it felt like I could not breath on my own, I panicked. I voluntarily came to this hospital for help because I could not make out what was going on with me. Then, I was sent to [facility]. When I got to [facility], my wife told them that I ate bunch of pills to kill myself. One thing led to the other. The next thing I knew I was shackled like a criminal and brought down here. My wife said all these things may be to have something on me to take to court with her because she is trying to divorce me. Why do I want to die? I am not depressed or thinking about dying. I did not at any time threaten and or wish to die. All I want now is to smoke me a cigarette".  Discharge Diagnoses: Active Problems:  TOBACCO ABUSE  Polysubstance dependence including opioid type drug, episodic abuse   Axis Diagnosis:   AXIS I:  Substance Abuse and Substance Induced Mood Disorder AXIS II:  Deferred AXIS III:   Past Medical  History  Diagnosis Date  . No pertinent past medical history    AXIS IV:  other psychosocial or environmental problems and problems related to social environment AXIS V:  51-60 moderate symptoms  Level of Care:  OP  Hospital Course:   Pt was admitted for stabilization.  He was started on Celexs and Neurontin with a good result.  He decided that he had things to live for.  He was focused on getting out and going to work and making some money to take care of his family.   He was discharged in improved condition with his valuables for follow-up with Dr Gillie Manners at [facility] on June 4th at 12:30.  Consults:  None  Significant Diagnostic Studies:  labs: UDS positive for Cocaine and THC.  Discharge Vitals:   Blood pressure 106/69, pulse 67, temperature 97.9 F (36.6 C), temperature source Oral, resp. rate 16, height 5\' 7"  (1.702 m), weight 101.606 kg (224 lb).  Mental Status Exam: See Mental Status Examination and Suicide Risk Assessment completed by Attending Physician prior to discharge.  Discharge destination:  Home  Is patient on multiple antipsychotic therapies at discharge:  No   Has Patient had three or more failed trials of antipsychotic monotherapy by history:  N/A Recommended Plan for Multiple Antipsychotic Therapies: N/A   Medication List  As  of 04/01/2012  5:19 PM   TAKE these medications      Indication    amitriptyline 25 MG tablet   Commonly known as: ELAVIL   Take 1 tablet (25 mg total) by mouth at bedtime. For pain management, depression, and insomnia.       citalopram 20 MG tablet   Commonly known as: CELEXA   Take 1 tablet (20 mg total) by mouth daily. For depression.       diclofenac 1.3 % Ptch   Commonly known as: FLECTOR   Place 1 patch onto the skin 2 (two) times daily. For management of pain.       gabapentin 300 MG capsule   Commonly known as: NEURONTIN   Take 1 capsule (300 mg total) by mouth 3 (three) times daily. For anxiety         ibuprofen 200 MG tablet   Commonly known as: ADVIL,MOTRIN   Take 400 mg by mouth every 6 (six) hours as needed. Pain         nicotine 21 mg/24hr patch   Commonly known as: NICODERM CQ - dosed in mg/24 hours   Place 1 patch onto the skin daily. For smoking cessation.       traZODone 100 MG tablet   Commonly known as: DESYREL   Take 1 tablet (100 mg total) by mouth at bedtime as needed for sleep.    Indication: Trouble Sleeping           Follow-up Information    Follow up with  Dr. Gillie Manners - [facility] on 04/01/2012. (You are scheduled with Dr. Barrie Folk on Tuesday, April 01, 2012 at 12:30 p.m.)          Follow-up recommendations:   Activities: Resume typical activities Diet: Resume typical diet Other: Follow up with outpatient provider and report any side effects to out patient prescriber.  Comments:  Take all your medicines as prescribed.  Report to your outpatient provider any adverse effects from medications promptly.  Patient has been instructed and cautioned to not engage in alcohol and or illegal drug use while on prescription medicines.  In the event of worsening symptoms, patient is instructed to call the crisis hotline, 911 and or go to the nearest ED.  SignedDan Humphreys, Atharv Barriere 04/01/2012, 5:19 PM

## 2013-04-27 ENCOUNTER — Encounter (HOSPITAL_COMMUNITY): Payer: Self-pay | Admitting: *Deleted

## 2013-04-27 ENCOUNTER — Emergency Department (HOSPITAL_COMMUNITY)
Admission: EM | Admit: 2013-04-27 | Discharge: 2013-04-27 | Disposition: A | Discharge status: Home / Self Care | Payer: Self-pay | Attending: Emergency Medicine | Admitting: Emergency Medicine

## 2013-04-27 DIAGNOSIS — Y929 Unspecified place or not applicable: Secondary | ICD-10-CM | POA: Insufficient documentation

## 2013-04-27 DIAGNOSIS — X58XXXA Exposure to other specified factors, initial encounter: Secondary | ICD-10-CM | POA: Insufficient documentation

## 2013-04-27 DIAGNOSIS — Y939 Activity, unspecified: Secondary | ICD-10-CM | POA: Insufficient documentation

## 2013-04-27 DIAGNOSIS — F17200 Nicotine dependence, unspecified, uncomplicated: Secondary | ICD-10-CM | POA: Insufficient documentation

## 2013-04-27 DIAGNOSIS — S6992XA Unspecified injury of left wrist, hand and finger(s), initial encounter: Secondary | ICD-10-CM

## 2013-04-27 DIAGNOSIS — S61209A Unspecified open wound of unspecified finger without damage to nail, initial encounter: Secondary | ICD-10-CM | POA: Insufficient documentation

## 2013-04-27 MED ORDER — HYDROCODONE-ACETAMINOPHEN 5-325 MG PO TABS
2.0000 | ORAL_TABLET | Freq: Once | ORAL | Status: AC
  Administered 2013-04-27: 2 via ORAL
  Filled 2013-04-27: qty 2

## 2013-04-27 MED ORDER — CEPHALEXIN 500 MG PO CAPS: 500.0000 mg | ORAL_CAPSULE | Freq: Four times a day (QID) | ORAL | Status: DC

## 2013-04-27 NOTE — ED Provider Notes (Signed)
History  This chart was scribed for non-physician practitioner working with Phillip Razor, MD by Greggory Stallion, ED scribe. This patient was seen in room WTR9/WTR9 and the patient's care was started at 6:46 PM.  CSN: [id number] Arrival date & time 04/27/13  1839   Chief Complaint  Patient presents with  . Foreign Body in Skin   The history is provided by the patient. No language interpreter was used.    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 male who presents to the Emergency Department with chief complaint of a foreign body in his left index finger that happened 2 hours ago. He states it is a fish hook. Pt rates his pain 10/10. Pt states his tetanus is UTD. He states he has not taken any pain medication.   Past Medical History  Diagnosis Date  . No pertinent past medical history    History reviewed. No pertinent past surgical history. History reviewed. No pertinent family history. History  Substance Use Topics  . Smoking status: Current Every Day Smoker -- 1.00 packs/day  . Smokeless tobacco: Not on file  . Alcohol Use: 1.2 oz/week    2 Cans of beer per week    Review of Systems  A complete 10 system review of systems was obtained and all systems are negative except as noted in the HPI and PMH.   Allergies  Bee venom  Home Medications   Current Outpatient Rx  Name  Route  Sig  Dispense  Refill  . EXPIRED: amitriptyline (ELAVIL) 25 MG tablet   Oral   Take 1 tablet (25 mg total) by mouth at bedtime. For pain management, depression, and insomnia.   30 tablet   0   . EXPIRED: citalopram (CELEXA) 20 MG tablet   Oral   Take 1 tablet (20 mg total) by mouth daily. For depression.   30 tablet   0   . diclofenac (FLECTOR) 1.3 % PTCH   Transdermal   Place 1 patch onto the skin 2 (two) times daily. For management of pain.   60 patch   0   . EXPIRED: gabapentin (NEURONTIN) 300 MG capsule   Oral   Take 1 capsule (300 mg total) by mouth 3 (three) times daily. For  anxiety   90 capsule   0   . ibuprofen (ADVIL,MOTRIN) 200 MG tablet   Oral   Take 400 mg by mouth every 6 (six) hours as needed. Pain          . EXPIRED: traZODone (DESYREL) 100 MG tablet   Oral   Take 1 tablet (100 mg total) by mouth at bedtime as needed for sleep.   30 tablet   0    BP 142/116  Pulse 79  Temp(Src) 99 F (37.2 C) (Oral)  Resp 18  SpO2 98%  Physical Exam  Nursing note and vitals reviewed. Constitutional: He is oriented to person, place, and time. He appears well-developed and well-nourished. No distress.  HENT:  Head: Normocephalic and atraumatic.  Eyes: EOM are normal.  Neck: Neck supple. No tracheal deviation present.  Cardiovascular: Normal rate.   Pulmonary/Chest: Effort normal. No respiratory distress.  Musculoskeletal: Normal range of motion.  No evidence of tendon involvement. ROM 5/5.  Neurological: He is alert and oriented to person, place, and time.  Skin: Skin is warm and dry.  Fish hook stuck in distal aspect of left index finger.   Psychiatric: He has a normal mood and affect. His behavior is normal.  ED Course  FOREIGN BODY REMOVAL Date/Time: 04/27/2013 8:20 PM Performed by: Roxy Horseman Authorized by: Roxy Horseman Consent: Verbal consent obtained. Risks and benefits: risks, benefits and alternatives were discussed Consent given by: patient Patient understanding: patient states understanding of the procedure being performed Patient consent: the patient's understanding of the procedure matches consent given Procedure consent: procedure consent matches procedure scheduled Relevant documents: relevant documents present and verified Test results: test results available and properly labeled Site marked: the operative site was marked Imaging studies: imaging studies available Required items: required blood products, implants, devices, and special equipment available Patient identity confirmed: verbally with patient Time out:  Immediately prior to procedure a "time out" was called to verify the correct patient, procedure, equipment, support staff and site/side marked as required. Body area: skin General location: upper extremity Location details: left index finger Anesthesia: digital block and local infiltration Local anesthetic: lidocaine 1% without epinephrine Anesthetic total: 3 ml Patient sedated: no Patient restrained: no Patient cooperative: yes Localization method: visualized Removal mechanism: forceps and hemostat Dressing: antibiotic ointment and dressing applied Tendon involvement: none Depth: subcutaneous Complexity: simple 1 objects recovered. Objects recovered: Fishhook Post-procedure assessment: foreign body removed Patient tolerance: Patient tolerated the procedure well with no immediate complications.   (including critical care time)  DIAGNOSTIC STUDIES: Oxygen Saturation is 98% on RA, normal by my interpretation.    COORDINATION OF CARE: 7:31 PM-Discussed treatment plan which includes removing the fish hook from his finger with pt at bedside and pt agreed to plan.   Labs Reviewed - No data to display No results found. 1. Fish hook injury of finger, left, initial encounter     MDM  Patient was fishhook in finger. Hook was removed successfully. Will treat the patient with Vicodin in the ED, and will give antibiotics. Her tetanus shot is up to date. Patient is stable and ready for discharge.        I personally performed the services described in this documentation, which was scribed in my presence. The recorded information has been reviewed and is accurate.    Roxy Horseman, PA-C 04/27/13 2022  Roxy Horseman, PA-C 04/27/13 2048

## 2013-04-27 NOTE — ED Notes (Signed)
Pt has fish hook to left index finger. Reports it has been stuck for . No bleeding noted.

## 2013-04-28 NOTE — ED Provider Notes (Signed)
Medical screening examination/treatment/procedure(s) were performed by non-physician practitioner and as supervising physician I was immediately available for consultation/collaboration.  Maleta Pacha, MD 04/28/13 0054 

## 2013-06-05 ENCOUNTER — Emergency Department (HOSPITAL_COMMUNITY)
Admission: EM | Admit: 2013-06-05 | Discharge: 2013-06-05 | Disposition: A | Discharge status: Home / Self Care | Payer: Self-pay | Attending: Emergency Medicine | Admitting: Emergency Medicine

## 2013-06-05 ENCOUNTER — Encounter (HOSPITAL_COMMUNITY): Payer: Self-pay | Admitting: *Deleted

## 2013-06-05 DIAGNOSIS — K089 Disorder of teeth and supporting structures, unspecified: Secondary | ICD-10-CM | POA: Insufficient documentation

## 2013-06-05 DIAGNOSIS — K0889 Other specified disorders of teeth and supporting structures: Secondary | ICD-10-CM

## 2013-06-05 DIAGNOSIS — F17200 Nicotine dependence, unspecified, uncomplicated: Secondary | ICD-10-CM | POA: Insufficient documentation

## 2013-06-05 DIAGNOSIS — K029 Dental caries, unspecified: Secondary | ICD-10-CM | POA: Insufficient documentation

## 2013-06-05 MED ORDER — HYDROCODONE-ACETAMINOPHEN 5-325 MG PO TABS
1.0000 | ORAL_TABLET | Freq: Once | ORAL | Status: AC
  Administered 2013-06-05: 1 via ORAL
  Filled 2013-06-05: qty 1

## 2013-06-05 MED ORDER — IBUPROFEN 800 MG PO TABS
800.0000 mg | ORAL_TABLET | Freq: Once | ORAL | Status: AC
  Administered 2013-06-05: 800 mg via ORAL
  Filled 2013-06-05: qty 1

## 2013-06-05 MED ORDER — HYDROCODONE-ACETAMINOPHEN 5-325 MG PO TABS: 1.0000 | ORAL_TABLET | Freq: Once | ORAL | Status: DC

## 2013-06-05 MED ORDER — IBUPROFEN 800 MG PO TABS: 800.0000 mg | ORAL_TABLET | Freq: Once | ORAL | Status: DC

## 2013-06-05 MED ORDER — PENICILLIN V POTASSIUM 500 MG PO TABS: 500.0000 mg | ORAL_TABLET | Freq: Four times a day (QID) | ORAL | Status: DC

## 2013-06-05 NOTE — ED Provider Notes (Signed)
  CSN: [id number]     Arrival date & time 06/05/13  5621 History     First MD Initiated Contact with Patient 06/05/13 [id number]     Chief Complaint  Patient presents with  . Dental Pain   (Consider location/radiation/quality/duration/timing/severity/associated sxs/prior Treatment) HPI Patient presents to the emergency department with dental pain that began 3 days, ago.  Patient, states, that his pain in his left upper first molar.  Patient, states, that he has not been able to sleep due to the pain.  He should states that he did not take any medications prior to arrival other than over-the-counter medications, which did not help.  Patient denies nausea, vomiting, fever, facial swelling, difficulty swallowing, difficulty breathing, or neck pain. Past Medical History  Diagnosis Date  . No pertinent past medical history    History reviewed. No pertinent past surgical history. No family history on file. History  Substance Use Topics  . Smoking status: Current Every Day Smoker -- 1.00 packs/day    Types: Cigarettes  . Smokeless tobacco: Not on file  . Alcohol Use: 1.2 oz/week    2 Cans of beer per week    Review of Systems All other systems negative except as documented in the HPI. All pertinent positives and negatives as reviewed in the HPI. Allergies  Bee venom  Home Medications   Current Outpatient Rx  Name  Route  Sig  Dispense  Refill  . ibuprofen (ADVIL,MOTRIN) 200 MG tablet   Oral   Take 800 mg by mouth every 6 (six) hours as needed for pain.          BP 139/96  Pulse 75  Temp(Src) 98.1 F (36.7 C) (Oral)  Resp 15  Ht 5\' 10"  (1.778 m)  Wt 215 lb (97.523 kg)  BMI 30.85 kg/m2  SpO2 98% Physical Exam  Nursing note and vitals reviewed. Constitutional: He is oriented to person, place, and time. He appears well-developed and well-nourished.  HENT:  Head: No trismus in the jaw.  Mouth/Throat: Uvula is midline, oropharynx is clear and moist and mucous membranes are normal.  Dental caries present. No dental abscesses or edematous.    Cardiovascular: Normal rate and regular rhythm.   Pulmonary/Chest: Effort normal and breath sounds normal.  Neurological: He is alert and oriented to person, place, and time.  Skin: Skin is warm and dry.    ED Course   Procedures (including critical care time)  Patient will be referred to dentistry and told to return here as needed  MDM    Carlyle Dolly, PA-C 06/05/13 [phone]

## 2013-06-05 NOTE — ED Provider Notes (Signed)
Medical screening examination/treatment/procedure(s) were performed by non-physician practitioner and as supervising physician I was immediately available for consultation/collaboration.  Peggy Monk Joseph Perfecto, MD 06/05/13 0904 

## 2013-06-05 NOTE — ED Notes (Signed)
PT reports dental pain x 3 days; pt reports its to the left side of his mouth and states "It's been leaking"

## 2014-05-20 ENCOUNTER — Encounter (HOSPITAL_COMMUNITY): Payer: Self-pay | Admitting: Emergency Medicine

## 2014-05-20 ENCOUNTER — Emergency Department (HOSPITAL_COMMUNITY)
Admission: EM | Admit: 2014-05-20 | Discharge: 2014-05-20 | Disposition: A | Discharge status: Home / Self Care | Payer: No Typology Code available for payment source | Attending: Emergency Medicine | Admitting: Emergency Medicine

## 2014-05-20 DIAGNOSIS — M549 Dorsalgia, unspecified: Secondary | ICD-10-CM | POA: Insufficient documentation

## 2014-05-20 DIAGNOSIS — F17200 Nicotine dependence, unspecified, uncomplicated: Secondary | ICD-10-CM | POA: Insufficient documentation

## 2014-05-20 DIAGNOSIS — M545 Low back pain, unspecified: Secondary | ICD-10-CM

## 2014-05-20 MED ORDER — METHOCARBAMOL 500 MG PO TABS: 1000.0000 mg | ORAL_TABLET | Freq: Four times a day (QID) | ORAL | Status: DC

## 2014-05-20 MED ORDER — NAPROXEN 500 MG PO TABS: 500.0000 mg | ORAL_TABLET | Freq: Two times a day (BID) | ORAL | Status: DC

## 2014-05-20 NOTE — ED Provider Notes (Signed)
CSN: [id number]     Arrival date & time 05/20/14  1800 History  This chart was scribed for Rhea Bleacher, PA, working with Toy Baker, MD by Chestine Spore, ED Scribe. The patient was seen in room WTR9/WTR9 at 7:18 PM.     Chief Complaint  Patient presents with  . Back Pain    The history is provided by the patient. No language interpreter was used.   HPI Comments: Phillip Anderson is a 33 y.o. male who presents to the Emergency Department complaining of back pain onset last night. He states that he was helping an older neighbor lift logs last night and his "back went out". He states that he went forward and he hit his head on the tree. He states that the pain was all of a sudden. He states that the pain got worse as the night went on. He states that he could not get out of bed this morning. He states that currently his pain is a 8/10.  He states that it hurts when he breathes.   He states that he has taken Ibuprofen with no relief for his symptoms. He states that he took a hot shower to try and relieve his symptoms with no relief. He denies urinary or bowel incontinence, fever, weight change, IV drugs, tick bite, and any other associated symptoms. He states that he has had upper back pain in the past. She states that she is not allergic to any medications.  Past Medical History  Diagnosis Date  . No pertinent past medical history    History reviewed. No pertinent past surgical history. No family history on file. History  Substance Use Topics  . Smoking status: Current Every Day Smoker -- 1.00 packs/day    Types: Cigarettes  . Smokeless tobacco: Not on file  . Alcohol Use: 1.2 oz/week    2 Cans of beer per week    Review of Systems  Constitutional: Negative for fever and unexpected weight change.  Gastrointestinal: Negative for constipation.       Neg for fecal incontinence  Genitourinary: Negative for hematuria, flank pain and difficulty urinating.       Negative for urinary  incontinence or retention  Musculoskeletal: Positive for back pain.  Neurological: Negative for weakness and numbness.       Negative for saddle paresthesias     Allergies  Bee venom  Home Medications   Prior to Admission medications   Medication Sig Start Date End Date Taking? Authorizing Provider  ibuprofen (ADVIL,MOTRIN) 200 MG tablet Take 600 mg by mouth every 6 (six) hours as needed (pain).    Yes Historical Provider, MD   BP 143/87  Pulse 73  Temp(Src) 98.5 F (36.9 C) (Oral)  Resp 18  SpO2 100%  Physical Exam  Nursing note and vitals reviewed. Constitutional: He appears well-developed and well-nourished.  HENT:  Head: Normocephalic and atraumatic.  Eyes: Conjunctivae are normal.  Neck: Normal range of motion.  Abdominal: Soft. There is no tenderness. There is no CVA tenderness.  Musculoskeletal: Normal range of motion.       Cervical back: He exhibits normal range of motion, no tenderness and no bony tenderness.       Thoracic back: He exhibits normal range of motion, no tenderness and no bony tenderness.       Lumbar back: He exhibits tenderness. He exhibits normal range of motion and no bony tenderness.       Back:  No step-off noted with palpation  of spine.   Neurological: He is alert. He has normal reflexes. No sensory deficit. He exhibits normal muscle tone.  5/5 strength in entire lower extremities bilaterally. No sensation deficit.   Skin: Skin is warm and dry.  Psychiatric: He has a normal mood and affect.    ED Course  Procedures (including critical care time) DIAGNOSTIC STUDIES: Oxygen Saturation is 100% on room air, normal by my interpretation.    COORDINATION OF CARE: 7:23 PM-Discussed treatment plan which includes robaxin, naproxen, rest with pt at bedside and pt agreed to plan.   Labs Review Labs Reviewed - No data to display  Imaging Review No results found.   EKG Interpretation None      7:40 PM Patient seen and examined. Work-up  initiated. Medications ordered.   Vital signs reviewed and are as follows: Filed Vitals:   05/20/14 1818  BP: 143/87  Pulse: 73  Temp: 98.5 F (36.9 C)  Resp: 18    No red flag s/s of low back pain. Patient was counseled on back pain precautions and told to do activity as tolerated but do not lift, push, or pull heavy objects more than 10 pounds for the next week.  Patient counseled to use ice or heat on back for no longer than 15 minutes every hour.   Patient prescribed muscle relaxer and counseled on proper use of muscle relaxant medication.    Patient prescribed narcotic pain medicine and counseled on proper use of narcotic pain medications. Counseled not to combine this medication with others containing tylenol.   Urged patient not to drink alcohol, drive, or perform any other activities that requires focus while taking either of these medications.  Patient urged to follow-up with PCP if pain does not improve with treatment and rest or if pain becomes recurrent. Urged to return with worsening severe pain, loss of bowel or bladder control, trouble walking.   The patient verbalizes understanding and agrees with the plan.   MDM   Final diagnoses:  Bilateral low back pain without sciatica   Patient with back pain. No neurological deficits. Patient is ambulatory. No warning symptoms of back pain including: loss of bowel or bladder control, night sweats, waking from sleep with back pain, unexplained fevers or weight loss, h/o cancer, IVDU, recent trauma. No concern for cauda equina, epidural abscess, or other serious cause of back pain. Conservative measures such as rest, ice/heat and pain medicine indicated with PCP follow-up if no improvement with conservative management.   I personally performed the services described in this documentation, which was scribed in my presence. The recorded information has been reviewed and is accurate.    Renne CriglerJoshua Graylen [place], PA-C 05/20/14 1940

## 2014-05-20 NOTE — ED Notes (Signed)
Pt A+Ox4, reports was helping a neighbor lift logs last night and "back gave out", reports "couldn't get out of bed this morning".  8/10 pain currently.  Pt denies n/t to extremities, denies bowel/bladder changes or complaints.  Skin PWD.  Ambulatory with slow steady gait.  NAD.

## 2014-05-20 NOTE — Discharge Instructions (Signed)
Please read and follow all provided instructions.  Your diagnoses today include:  1. Bilateral low back pain without sciatica     Tests performed today include:  Vital signs - see below for your results today  Medications prescribed:   Robaxin (methocarbamol) - muscle relaxer medication  DO NOT drive or perform any activities that require you to be awake and alert because this medicine can make you drowsy.    Naproxen - anti-inflammatory pain medication  Do not exceed 500mg  naproxen every 12 hours, take with food  You have been prescribed an anti-inflammatory medication or NSAID. Take with food. Take smallest effective dose for the shortest duration needed for your pain. Stop taking if you experience stomach pain or vomiting.   Take any prescribed medications only as directed.  Home care instructions:   Follow any educational materials contained in this packet  Please rest, use ice or heat on your back for the next several days  Do not lift, push, pull anything more than 10 pounds for the next week  Follow-up instructions: Please follow-up with your primary care provider in the next 1 week for further evaluation of your symptoms.   Return instructions:  SEEK IMMEDIATE MEDICAL ATTENTION IF YOU HAVE:  New numbness, tingling, weakness, or problem with the use of your arms or legs  Severe back pain not relieved with medications  Loss control of your bowels or bladder  Increasing pain in any areas of the body (such as chest or abdominal pain)  Shortness of breath, dizziness, or fainting.   Worsening nausea (feeling sick to your stomach), vomiting, fever, or sweats  Any other emergent concerns regarding your health   Additional Information:  Your vital signs today were: BP 143/87   Pulse 73   Temp(Src) 98.5 F (36.9 C) (Oral)   Resp 18   SpO2 100% If your blood pressure (BP) was elevated above 135/85 this visit, please have this repeated by your doctor within one  month. --------------

## 2014-05-20 NOTE — ED Provider Notes (Signed)
Medical screening examination/treatment/procedure(s) were performed by non-physician practitioner and as supervising physician I was immediately available for consultation/collaboration.  Mitsuko Luera T Mukesh Kornegay, MD 05/20/14 2140 

## 2014-05-27 ENCOUNTER — Encounter (HOSPITAL_BASED_OUTPATIENT_CLINIC_OR_DEPARTMENT_OTHER): Payer: Self-pay | Admitting: Emergency Medicine

## 2014-05-27 ENCOUNTER — Emergency Department (HOSPITAL_BASED_OUTPATIENT_CLINIC_OR_DEPARTMENT_OTHER)
Admission: EM | Admit: 2014-05-27 | Discharge: 2014-05-27 | Disposition: A | Discharge status: Home / Self Care | Payer: Medicaid Other | Attending: Emergency Medicine | Admitting: Emergency Medicine

## 2014-05-27 ENCOUNTER — Emergency Department (HOSPITAL_BASED_OUTPATIENT_CLINIC_OR_DEPARTMENT_OTHER): Payer: Medicaid Other

## 2014-05-27 DIAGNOSIS — M545 Low back pain, unspecified: Secondary | ICD-10-CM | POA: Insufficient documentation

## 2014-05-27 DIAGNOSIS — Z79899 Other long term (current) drug therapy: Secondary | ICD-10-CM | POA: Insufficient documentation

## 2014-05-27 DIAGNOSIS — G8911 Acute pain due to trauma: Secondary | ICD-10-CM | POA: Insufficient documentation

## 2014-05-27 DIAGNOSIS — S39012D Strain of muscle, fascia and tendon of lower back, subsequent encounter: Secondary | ICD-10-CM

## 2014-05-27 DIAGNOSIS — F17200 Nicotine dependence, unspecified, uncomplicated: Secondary | ICD-10-CM | POA: Insufficient documentation

## 2014-05-27 DIAGNOSIS — Z791 Long term (current) use of non-steroidal anti-inflammatories (NSAID): Secondary | ICD-10-CM | POA: Insufficient documentation

## 2014-05-27 MED ORDER — OXYCODONE-ACETAMINOPHEN 5-325 MG PO TABS
2.0000 | ORAL_TABLET | Freq: Once | ORAL | Status: AC
  Administered 2014-05-27: 2 via ORAL
  Filled 2014-05-27: qty 2

## 2014-05-27 MED ORDER — PREDNISONE 10 MG PO TABS: ORAL_TABLET | ORAL | Status: DC

## 2014-05-27 MED ORDER — OXYCODONE-ACETAMINOPHEN 5-325 MG PO TABS: 2.0000 | ORAL_TABLET | ORAL | Status: DC | PRN

## 2014-05-27 MED ORDER — KETOROLAC TROMETHAMINE 60 MG/2ML IM SOLN
60.0000 mg | Freq: Once | INTRAMUSCULAR | Status: AC
  Administered 2014-05-27: 60 mg via INTRAMUSCULAR
  Filled 2014-05-27: qty 2

## 2014-05-27 NOTE — Discharge Instructions (Signed)
Back Pain, Adult Low back pain is very common. About 1 in 5 people have back pain.The cause of low back pain is rarely dangerous. The pain often gets better over time.About half of people with a sudden onset of back pain feel better in just 2 weeks. About 8 in 10 people feel better by 6 weeks.  CAUSES Some common causes of back pain include:  Strain of the muscles or ligaments supporting the spine.  Wear and tear (degeneration) of the spinal discs.  Arthritis.  Direct injury to the back. DIAGNOSIS Most of the time, the direct cause of low back pain is not known.However, back pain can be treated effectively even when the exact cause of the pain is unknown.Answering your caregiver's questions about your overall health and symptoms is one of the most accurate ways to make sure the cause of your pain is not dangerous. If your caregiver needs more information, he or she may order lab work or imaging tests (X-rays or MRIs).However, even if imaging tests show changes in your back, this usually does not require surgery. HOME CARE INSTRUCTIONS For many people, back pain returns.Since low back pain is rarely dangerous, it is often a condition that people can learn to manageon their own.   Remain active. It is stressful on the back to sit or stand in one place. Do not sit, drive, or stand in one place for more than 30 minutes at a time. Take short walks on level surfaces as soon as pain allows.Try to increase the length of time you walk each day.  Do not stay in bed.Resting more than 1 or 2 days can delay your recovery.  Do not avoid exercise or work.Your body is made to move.It is not dangerous to be active, even though your back may hurt.Your back will likely heal faster if you return to being active before your pain is gone.  Pay attention to your body when you bend and lift. Many people have less discomfortwhen lifting if they bend their knees, keep the load close to their bodies,and  avoid twisting. Often, the most comfortable positions are those that put less stress on your recovering back.  Find a comfortable position to sleep. Use a firm mattress and lie on your side with your knees slightly bent. If you lie on your back, put a pillow under your knees.  Only take over-the-counter or prescription medicines as directed by your caregiver. Over-the-counter medicines to reduce pain and inflammation are often the most helpful.Your caregiver may prescribe muscle relaxant drugs.These medicines help dull your pain so you can more quickly return to your normal activities and healthy exercise.  Put ice on the injured area.  Put ice in a plastic bag.  Place a towel between your skin and the bag.  Leave the ice on for 15-20 minutes, 03-04 times a day for the first 2 to 3 days. After that, ice and heat may be alternated to reduce pain and spasms.  Ask your caregiver about trying back exercises and gentle massage. This may be of some benefit.  Avoid feeling anxious or stressed.Stress increases muscle tension and can worsen back pain.It is important to recognize when you are anxious or stressed and learn ways to manage it.Exercise is a great option. SEEK MEDICAL CARE IF:  You have pain that is not relieved with rest or medicine.  You have pain that does not improve in 1 week.  You have new symptoms.  You are generally not feeling well. SEEK   IMMEDIATE MEDICAL CARE IF:   You have pain that radiates from your back into your legs.  You develop new bowel or bladder control problems.  You have unusual weakness or numbness in your arms or legs.  You develop nausea or vomiting.  You develop abdominal pain.  You feel faint. Document Released: 10/15/2005 Document Revised: 04/15/2012 Document Reviewed: 02/16/2014 [employer] Patient Information 2015 [employer]. This information is not intended to replace advice given to you by your health care provider. Make sure you  discuss any questions you have with your health care provider.  

## 2014-05-27 NOTE — ED Provider Notes (Signed)
CSN: [id number]     Arrival date & time 05/27/14  1815 History   First MD Initiated Contact with Patient 05/27/14 1827     Chief Complaint  Patient presents with  . Back Pain     (Consider location/radiation/quality/duration/timing/severity/associated sxs/prior Treatment) Patient is a 33 y.o. male presenting with back pain. The history is provided by the patient. No language interpreter was used.  Back Pain Location:  Lumbar spine Quality:  Aching Radiates to:  Does not radiate Pain severity:  Moderate Pain is:  Worse during the day Onset quality:  Gradual Duration:  1 week Timing:  Constant Progression:  Worsening Chronicity:  New Relieved by:  Nothing Worsened by:  Nothing tried Ineffective treatments:  None tried Associated symptoms: leg pain   Pt reports he lifted a log for his neighbor and has had back pain since.  Past Medical History  Diagnosis Date  . No pertinent past medical history    History reviewed. No pertinent past surgical history. No family history on file. History  Substance Use Topics  . Smoking status: Current Every Day Smoker -- 1.00 packs/day    Types: Cigarettes  . Smokeless tobacco: Not on file  . Alcohol Use: 1.2 oz/week    2 Cans of beer per week    Review of Systems  Musculoskeletal: Positive for back pain.  All other systems reviewed and are negative.     Allergies  Bee venom  Home Medications   Prior to Admission medications   Medication Sig Start Date End Date Taking? Authorizing Provider  ibuprofen (ADVIL,MOTRIN) 200 MG tablet Take 600 mg by mouth every 6 (six) hours as needed (pain).     Historical Provider, MD  methocarbamol (ROBAXIN) 500 MG tablet Take 2 tablets (1,000 mg total) by mouth 4 (four) times daily. 05/20/14   Renne CriglerJoshua Geiple, PA-C  naproxen (NAPROSYN) 500 MG tablet Take 1 tablet (500 mg total) by mouth 2 (two) times daily. 05/20/14   Renne CriglerJoshua Geiple, PA-C   BP 159/82  Pulse 79  Temp(Src) 98.7 F (37.1 C) (Oral)   Resp 18  Ht 5\' 11"  (1.803 m)  Wt 215 lb (97.523 kg)  BMI 30.00 kg/m2  SpO2 100% Physical Exam  Nursing note and vitals reviewed. Constitutional: He is oriented to person, place, and time. He appears well-developed and well-nourished.  HENT:  Head: Normocephalic.  Eyes: Pupils are equal, round, and reactive to light.  Cardiovascular: Normal rate and regular rhythm.   Pulmonary/Chest: Effort normal.  Abdominal: Soft.  Musculoskeletal: Normal range of motion.  Neurological: He is alert and oriented to person, place, and time.  Skin: Skin is warm.  Psychiatric: He has a normal mood and affect.    ED Course  Procedures (including critical care time) Labs Review Labs Reviewed - No data to display  Imaging Review Dg Lumbar Spine Complete  05/27/2014   CLINICAL DATA:  Low back pain, bending injury 1 week ago.  EXAM: LUMBAR SPINE - COMPLETE 4+ VIEW  COMPARISON:  None.  FINDINGS: Five non rib-bearing lumbar-type vertebral bodies are intact and aligned with maintenance of the lumbar lordosis. Intervertebral disc heights are normal. No pars interarticularis defects. No destructive bony lesions.  Sacroiliac joints are symmetric. Included prevertebral and paraspinal soft tissue planes are non-suspicious.  IMPRESSION: Negative.   Electronically Signed   By: Awilda Metroourtnay  Bloomer   On: 05/27/2014 19:41     EKG Interpretation None      MDM   Final diagnoses:  Lumbar strain, subsequent encounter  No relief with torodol.  Ls spine xray normal.    Pt referred to Dr. Pearletha Forge for evaluation Rx for Prednisone taper Percocet     Phillip Areas, PA-C 05/27/14 2123

## 2014-05-27 NOTE — ED Notes (Signed)
Lower back pain for a week since helping a neighbor do some yard work. [facility] but pain is not getting better.

## 2014-05-27 NOTE — ED Provider Notes (Signed)
History/physical exam/procedure(s) were performed by non-physician practitioner and as supervising physician I was immediately available for consultation/collaboration. I have reviewed all notes and am in agreement with care and plan.   Hilario Quarryanielle S Agustine Rossitto, MD 05/27/14 [phone]

## 2014-07-03 ENCOUNTER — Emergency Department (HOSPITAL_BASED_OUTPATIENT_CLINIC_OR_DEPARTMENT_OTHER): Payer: Medicaid Other

## 2014-07-03 ENCOUNTER — Encounter (HOSPITAL_BASED_OUTPATIENT_CLINIC_OR_DEPARTMENT_OTHER): Payer: Self-pay | Admitting: Emergency Medicine

## 2014-07-03 ENCOUNTER — Emergency Department (HOSPITAL_BASED_OUTPATIENT_CLINIC_OR_DEPARTMENT_OTHER)
Admission: EM | Admit: 2014-07-03 | Discharge: 2014-07-03 | Disposition: A | Discharge status: Home / Self Care | Payer: Medicaid Other | Attending: Emergency Medicine | Admitting: Emergency Medicine

## 2014-07-03 DIAGNOSIS — F17200 Nicotine dependence, unspecified, uncomplicated: Secondary | ICD-10-CM | POA: Insufficient documentation

## 2014-07-03 DIAGNOSIS — N509 Disorder of male genital organs, unspecified: Secondary | ICD-10-CM | POA: Insufficient documentation

## 2014-07-03 DIAGNOSIS — N50812 Left testicular pain: Secondary | ICD-10-CM

## 2014-07-03 LAB — BASIC METABOLIC PANEL
Anion gap: 13 (ref 5–15)
BUN: <3 mg/dL — ABNORMAL LOW (ref 6–23)
CO2: 24 mEq/L (ref 19–32)
Calcium: 9.5 mg/dL (ref 8.4–10.5)
Chloride: 101 mEq/L (ref 96–112)
Creatinine, Ser: 1.1 mg/dL (ref 0.50–1.35)
GFR calc Af Amer: >90 mL/min (ref >90)
GFR calc non Af Amer: 86 mL/min — ABNORMAL LOW (ref >90)
Glucose, Bld: 92 mg/dL (ref 70–99)
Potassium: 4.6 mEq/L (ref 3.7–5.3)
Sodium: 138 mEq/L (ref 137–147)

## 2014-07-03 LAB — CBC WITH DIFFERENTIAL/PLATELET
Basophils Absolute: 0 10*3/uL (ref 0.0–0.1)
Basophils Relative: 0 % (ref 0–1)
Eosinophils Absolute: 0.1 10*3/uL (ref 0.0–0.7)
Eosinophils Relative: 1 % (ref 0–5)
HCT: 44.4 % (ref 39.0–52.0)
Hemoglobin: 15.1 g/dL (ref 13.0–17.0)
Lymphocytes Relative: 16 % (ref 12–46)
Lymphs Abs: 1.5 10*3/uL (ref 0.7–4.0)
MCH: 31.5 pg (ref 26.0–34.0)
MCHC: 34 g/dL (ref 30.0–36.0)
MCV: 92.7 fL (ref 78.0–100.0)
Monocytes Absolute: 0.5 10*3/uL (ref 0.1–1.0)
Monocytes Relative: 5 % (ref 3–12)
Neutro Abs: 7.3 10*3/uL (ref 1.7–7.7)
Neutrophils Relative %: 78 % — ABNORMAL HIGH (ref 43–77)
Platelets: 160 10*3/uL (ref 150–400)
RBC: 4.79 MIL/uL (ref 4.22–5.81)
RDW: 12.5 % (ref 11.5–15.5)
WBC: 9.4 10*3/uL (ref 4.0–10.5)

## 2014-07-03 LAB — URINALYSIS, ROUTINE W REFLEX MICROSCOPIC
Bilirubin Urine: NEGATIVE
Glucose, UA: NEGATIVE mg/dL
Hgb urine dipstick: NEGATIVE
Ketones, ur: NEGATIVE mg/dL
Leukocytes, UA: NEGATIVE
Nitrite: NEGATIVE
Protein, ur: NEGATIVE mg/dL
Specific Gravity, Urine: 1.01 (ref 1.005–1.030)
Urobilinogen, UA: 0.2 mg/dL (ref 0.0–1.0)
pH: 7 (ref 5.0–8.0)

## 2014-07-03 MED ORDER — KETOROLAC TROMETHAMINE 30 MG/ML IJ SOLN
30.0000 mg | Freq: Once | INTRAMUSCULAR | Status: AC
  Administered 2014-07-03: 30 mg via INTRAVENOUS
  Filled 2014-07-03: qty 1

## 2014-07-03 MED ORDER — MORPHINE SULFATE 4 MG/ML IJ SOLN
4.0000 mg | INTRAMUSCULAR | Status: DC | PRN
  Administered 2014-07-03: 4 mg via INTRAVENOUS
  Filled 2014-07-03: qty 1

## 2014-07-03 MED ORDER — OXYCODONE-ACETAMINOPHEN 5-325 MG PO TABS: ORAL_TABLET | ORAL | Status: DC

## 2014-07-03 MED ORDER — SODIUM CHLORIDE 0.9 % IV BOLUS (SEPSIS)
1000.0000 mL | Freq: Once | INTRAVENOUS | Status: AC
  Administered 2014-07-03: 1000 mL via INTRAVENOUS

## 2014-07-03 NOTE — ED Provider Notes (Signed)
Complains of left testicle pain onset 515 PM yesterday, sudden onset. Pain is worse with changing positions improved with remaining still. No urinary symptoms no back pain no abdominal pain no other complaint. On exam appears mildly uncomfortable Glasgow Coma Score 15 abdomen nondistended nontender. Back without point tenderness or flank tenderness genital area normal male, testes and normal lie, scrotum is red warm or swollen. Left testicle is diffusely tender. Greater anteriorly and posteriorly. No scrotal masses. Right testicle is normal  Doug Sou, MD 07/03/14 1515

## 2014-07-03 NOTE — ED Provider Notes (Signed)
CSN: [id number]     Arrival date & time 07/03/14  1246 History   First MD Initiated Contact with Patient 07/03/14 1311     Chief Complaint  Patient presents with  . Testicle Pain     (Consider location/radiation/quality/duration/timing/severity/associated sxs/prior Treatment) HPI  Phillip Anderson is a 33 y.o. male complaining of acute onset of severe left testicular pain yesterday at 5:30 PM. Patient was coming home from work and finished a shower, he was walking across department with acute onset of pain. No prior similar episodes, pain is severe, 9/10, no exacerbating or alleviating factors identified. Patient denies testicular swelling, dysuria, urethral discharge, fever, chills, nausea or vomiting. Patient's brother had testicular torsion when he was a child. Last oral intake was a sandwich approximately 10 AM today.  Past Medical History  Diagnosis Date  . No pertinent past medical history    History reviewed. No pertinent past surgical history. No family history on file. History  Substance Use Topics  . Smoking status: Current Every Day Smoker -- 1.00 packs/day    Types: Cigarettes  . Smokeless tobacco: Not on file  . Alcohol Use: 1.2 oz/week    2 Cans of beer per week    Review of Systems  10 systems reviewed and found to be negative, except as noted in the HPI.   Allergies  Bee venom  Home Medications   Prior to Admission medications   Medication Sig Start Date End Date Taking? Authorizing Provider  ibuprofen (ADVIL,MOTRIN) 200 MG tablet Take 600 mg by mouth every 6 (six) hours as needed (pain).     Historical Provider, MD  oxyCODONE-acetaminophen (PERCOCET/ROXICET) 5-325 MG per tablet 1 to 2 tabs PO q6hrs  PRN for pain 07/03/14   Joni Reining Dontrae Morini, PA-C   BP 138/62  Pulse 60  Temp(Src) 97.7 F (36.5 C) (Oral)  Resp 18  SpO2 100% Physical Exam  Nursing note and vitals reviewed. Constitutional: He is oriented to person, place, and time. He appears well-developed  and well-nourished. No distress.  HENT:  Head: Normocephalic.  Eyes: Conjunctivae and EOM are normal.  Cardiovascular: Normal rate and regular rhythm.   Pulmonary/Chest: Effort normal and breath sounds normal. No stridor.  Abdominal: Soft. There is no tenderness.  Genitourinary:  Exam chaperoned by technician:  Circumcised, no urethral discharge, no swelling +  tenderness palpation of the scrotum bilaterally. Prehn sign is negative.  Musculoskeletal: Normal range of motion.  Neurological: He is alert and oriented to person, place, and time.  Psychiatric: He has a normal mood and affect.    ED Course  Procedures (including critical care time) Labs Review Labs Reviewed  BASIC METABOLIC PANEL - Abnormal; Notable for the following:    BUN <3 (*)    GFR calc non Af Amer 86 (*)    All other components within normal limits  CBC WITH DIFFERENTIAL - Abnormal; Notable for the following:    Neutrophils Relative % 78 (*)    All other components within normal limits  CHLAMYDIA TRACHOMATIS, PROBE AMP  NEISSERIA GONORRHOEAE, PROBE AMP  URINALYSIS, ROUTINE W REFLEX MICROSCOPIC    Imaging Review US Scrotum  07/03/2014   CLINICAL DATA:  Left-sided testicular pain since last night. No known trauma.  EXAM: ULTRASOUND OF SCROTUM  TECHNIQUE: Complete ultrasound examination of the testicles, epididymis, and other scrotal structures was performed.  COMPARISON:  None.  FINDINGS: Right testicle  Measurements: Normal in size measuring 4.3 x 2.3 x 2.9 cm. There is relative homogeneity of the right  testis parenchyma. No discrete intra or extratesticular mass. Normal low resistance arterial and venous waveforms are demonstrated within the right testis parenchyma.  Left testicle  Measurements: Normal in size measuring 4.6 x 2.2 x 2.9 cm. There is relative homogeneity of the left testis parenchyma. Note is made of a punctate (approximately 0.1 cm echogenic focus within the caudal aspect of the left testis (image  51), favored to represent a solitary calcification and not meeting the criteria for classical microlithiasis. No discrete intra or extratesticular mass. Normal low resistance arterial and venous waveforms are demonstrated within the left testis parenchyma.  Right epididymis:  Normal in size and appearance.  Left epididymis:  Normal in size and appearance.  Hydrocele:  None identified.  Varicocele:  None visualized.  IMPRESSION: No explanation for patient's left testicular pain. Specifically, no evidence of testicular mass or torsion.   Electronically Signed   By: Simonne Come M.D.   On: 07/03/2014 14:12   [place] Art/ven Flow Abd Pelv Doppler  07/03/2014   CLINICAL DATA:  Left-sided testicular pain since last night. No known trauma.  EXAM: ULTRASOUND OF SCROTUM  TECHNIQUE: Complete ultrasound examination of the testicles, epididymis, and other scrotal structures was performed.  COMPARISON:  None.  FINDINGS: Right testicle  Measurements: Normal in size measuring 4.3 x 2.3 x 2.9 cm. There is relative homogeneity of the right testis parenchyma. No discrete intra or extratesticular mass. Normal low resistance arterial and venous waveforms are demonstrated within the right testis parenchyma.  Left testicle  Measurements: Normal in size measuring 4.6 x 2.2 x 2.9 cm. There is relative homogeneity of the left testis parenchyma. Note is made of a punctate (approximately 0.1 cm echogenic focus within the caudal aspect of the left testis (image 51), favored to represent a solitary calcification and not meeting the criteria for classical microlithiasis. No discrete intra or extratesticular mass. Normal low resistance arterial and venous waveforms are demonstrated within the left testis parenchyma.  Right epididymis:  Normal in size and appearance.  Left epididymis:  Normal in size and appearance.  Hydrocele:  None identified.  Varicocele:  None visualized.  IMPRESSION: No explanation for patient's left testicular pain.  Specifically, no evidence of testicular mass or torsion.   Electronically Signed   By: Simonne Come M.D.   On: 07/03/2014 14:12     EKG Interpretation None      MDM   Final diagnoses:  Testicular pain, left    Filed Vitals:   07/03/14 1254 07/03/14 1628  BP: 120/83 138/62  Pulse: 79 60  Temp: 97.7 F (36.5 C)   TempSrc: Oral   Resp: 18   SpO2: 100% 100%    Medications  morphine 4 MG/ML injection 4 mg (4 mg Intravenous Given 07/03/14 1409)  sodium chloride 0.9 % bolus 1,000 mL (0 mLs Intravenous Stopped 07/03/14 1456)  ketorolac (TORADOL) 30 MG/ML injection 30 mg (30 mg Intravenous Given 07/03/14 1514)    Phillip Anderson is a 33 y.o. male presenting with 2 onset of severe left testicular pain at 5:30 PM yesterday. No other associated symptoms.  dDx: Testicular torsion, epididymitis  Ultrasound shows no masses, normal blood flow. No signs of epididymitis. Blood work and urinalysis with no acute abnormalities. Patient will be encouraged to follow with urologist. Pain control medications given.  This is a shared visit with the attending physician who personally evaluated the patient and agrees with the care plan.   Evaluation does not show pathology that would require ongoing emergent intervention  or inpatient treatment. Pt is hemodynamically stable and mentating appropriately. Discussed findings and plan with patient/guardian, who agrees with care plan. All questions answered. Return precautions discussed and outpatient follow up given.   Discharge Medication List as of 07/03/2014  4:30 PM    START taking these medications   Details  oxyCODONE-acetaminophen (PERCOCET/ROXICET) 5-325 MG per tablet 1 to 2 tabs PO q6hrs  PRN for pain, Print             Wynetta Emery, PA-C 07/03/14 1714

## 2014-07-03 NOTE — Discharge Instructions (Signed)
For pain control please take ibuprofen (also known as Motrin or Advil)  (this is normally 4 over the counter pills) 3 times a day  for 5 days. Take with food to minimize stomach irritation. Take percocet for breakthrough pain, do not drink alcohol, drive, care for children or do other critical tasks while taking percocet. Follow as soon as possible with the urologist.  Do not hesitate to return to the emergency room for any new, worsening or concerning symptoms.   Pain of Unknown Etiology (Pain Without a Known Cause) You have come to your caregiver because of pain. Pain can occur in any part of the body. Often there is not a definite cause. If your laboratory (blood or urine) work was normal and X-rays or other studies were normal, your caregiver may treat you without knowing the cause of the pain. An example of this is the headache. Most headaches are diagnosed by taking a history. This means your caregiver asks you questions about your headaches. Your caregiver determines a treatment based on your answers. Usually testing done for headaches is normal. Often testing is not done unless there is no response to medications. Regardless of where your pain is located today, you can be given medications to make you comfortable. If no physical cause of pain can be found, most cases of pain will gradually leave as suddenly as they came.  If you have a painful condition and no reason can be found for the pain, it is important that you follow up with your caregiver. If the pain becomes worse or does not go away, it may be necessary to repeat tests and look further for a possible cause.  Only take over-the-counter or prescription medicines for pain, discomfort, or fever as directed by your caregiver.  For the protection of your privacy, test results cannot be given over the phone. Make sure you receive the results of your test. Ask how these results are to be obtained if you have not been informed. It is your  responsibility to obtain your test results.  You may continue all activities unless the activities cause more pain. When the pain lessens, it is important to gradually resume normal activities. Resume activities by beginning slowly and gradually increasing the intensity and duration of the activities or exercise. During periods of severe pain, bed rest may be helpful. Lie or sit in any position that is comfortable.  Ice used for acute (sudden) conditions may be effective. Use a large plastic bag filled with ice and wrapped in a towel. This may provide pain relief.  See your caregiver for continued problems. Your caregiver can help or refer you for exercises or physical therapy if necessary. If you were given medications for your condition, do not drive, operate machinery or power tools, or sign legal documents for 24 hours. Do not drink alcohol, take sleeping pills, or take other medications that may interfere with treatment. See your caregiver immediately if you have pain that is becoming worse and not relieved by medications. Document Released: 07/10/2001 Document Revised: 08/05/2013 Document Reviewed: 10/15/2005 [facility] Patient Information 2015 [place], [place]. This information is not intended to replace advice given to you by your health care provider. Make sure you discuss any questions you have with your health care provider.

## 2014-07-03 NOTE — ED Notes (Signed)
Patient here with pain to scrotum that started after work yesterday. Reports that the pain is worse with movement, no pain with dysuria

## 2014-07-04 ENCOUNTER — Emergency Department (HOSPITAL_BASED_OUTPATIENT_CLINIC_OR_DEPARTMENT_OTHER): Payer: Self-pay

## 2014-07-04 ENCOUNTER — Encounter (HOSPITAL_BASED_OUTPATIENT_CLINIC_OR_DEPARTMENT_OTHER): Payer: Self-pay | Admitting: Emergency Medicine

## 2014-07-04 ENCOUNTER — Emergency Department (HOSPITAL_BASED_OUTPATIENT_CLINIC_OR_DEPARTMENT_OTHER)
Admission: EM | Admit: 2014-07-04 | Discharge: 2014-07-04 | Disposition: A | Discharge status: Home / Self Care | Payer: Self-pay | Attending: Emergency Medicine | Admitting: Emergency Medicine

## 2014-07-04 ENCOUNTER — Emergency Department (HOSPITAL_BASED_OUTPATIENT_CLINIC_OR_DEPARTMENT_OTHER): Payer: Medicaid Other

## 2014-07-04 DIAGNOSIS — F17200 Nicotine dependence, unspecified, uncomplicated: Secondary | ICD-10-CM | POA: Insufficient documentation

## 2014-07-04 DIAGNOSIS — N50812 Left testicular pain: Secondary | ICD-10-CM

## 2014-07-04 DIAGNOSIS — N509 Disorder of male genital organs, unspecified: Secondary | ICD-10-CM | POA: Insufficient documentation

## 2014-07-04 DIAGNOSIS — R109 Unspecified abdominal pain: Secondary | ICD-10-CM | POA: Insufficient documentation

## 2014-07-04 LAB — CBC WITH DIFFERENTIAL/PLATELET
Basophils Absolute: 0 10*3/uL (ref 0.0–0.1)
Basophils Relative: 0 % (ref 0–1)
Eosinophils Absolute: 0.1 10*3/uL (ref 0.0–0.7)
Eosinophils Relative: 1 % (ref 0–5)
HCT: 43.6 % (ref 39.0–52.0)
Hemoglobin: 14.8 g/dL (ref 13.0–17.0)
Lymphocytes Relative: 18 % (ref 12–46)
Lymphs Abs: 1.1 10*3/uL (ref 0.7–4.0)
MCH: 31 pg (ref 26.0–34.0)
MCHC: 33.9 g/dL (ref 30.0–36.0)
MCV: 91.4 fL (ref 78.0–100.0)
Monocytes Absolute: 0.4 10*3/uL (ref 0.1–1.0)
Monocytes Relative: 6 % (ref 3–12)
Neutro Abs: 4.6 10*3/uL (ref 1.7–7.7)
Neutrophils Relative %: 75 % (ref 43–77)
Platelets: 173 10*3/uL (ref 150–400)
RBC: 4.77 MIL/uL (ref 4.22–5.81)
RDW: 12.5 % (ref 11.5–15.5)
WBC: 6.1 10*3/uL (ref 4.0–10.5)

## 2014-07-04 LAB — URINALYSIS, ROUTINE W REFLEX MICROSCOPIC
Bilirubin Urine: NEGATIVE
Glucose, UA: NEGATIVE mg/dL
Hgb urine dipstick: NEGATIVE
Ketones, ur: NEGATIVE mg/dL
Leukocytes, UA: NEGATIVE
Nitrite: NEGATIVE
Protein, ur: NEGATIVE mg/dL
Specific Gravity, Urine: 1.007 (ref 1.005–1.030)
Urobilinogen, UA: 0.2 mg/dL (ref 0.0–1.0)
pH: 7 (ref 5.0–8.0)

## 2014-07-04 LAB — COMPREHENSIVE METABOLIC PANEL
ALT: 14 U/L (ref 0–53)
AST: 16 U/L (ref 0–37)
Albumin: 3.9 g/dL (ref 3.5–5.2)
Alkaline Phosphatase: 51 U/L (ref 39–117)
Anion gap: 14 (ref 5–15)
BUN: 9 mg/dL (ref 6–23)
CO2: 23 mEq/L (ref 19–32)
Calcium: 9.5 mg/dL (ref 8.4–10.5)
Chloride: 105 mEq/L (ref 96–112)
Creatinine, Ser: 1.1 mg/dL (ref 0.50–1.35)
GFR calc Af Amer: >90 mL/min (ref >90)
GFR calc non Af Amer: 86 mL/min — ABNORMAL LOW (ref >90)
Glucose, Bld: 81 mg/dL (ref 70–99)
Potassium: 4.4 mEq/L (ref 3.7–5.3)
Sodium: 142 mEq/L (ref 137–147)
Total Bilirubin: 0.6 mg/dL (ref 0.3–1.2)
Total Protein: 7.4 g/dL (ref 6.0–8.3)

## 2014-07-04 MED ORDER — OXYCODONE-ACETAMINOPHEN 5-325 MG PO TABS: 1.0000 | ORAL_TABLET | ORAL | Status: DC | PRN

## 2014-07-04 MED ORDER — OXYCODONE-ACETAMINOPHEN 5-325 MG PO TABS: 1.0000 | ORAL_TABLET | Freq: Four times a day (QID) | ORAL | Status: DC | PRN

## 2014-07-04 MED ORDER — MORPHINE SULFATE 4 MG/ML IJ SOLN
4.0000 mg | Freq: Once | INTRAMUSCULAR | Status: AC
  Administered 2014-07-04: 4 mg via INTRAVENOUS
  Filled 2014-07-04: qty 1

## 2014-07-04 MED ORDER — MORPHINE SULFATE 4 MG/ML IJ SOLN
6.0000 mg | Freq: Once | INTRAMUSCULAR | Status: AC
  Administered 2014-07-04: 6 mg via INTRAVENOUS
  Filled 2014-07-04: qty 2

## 2014-07-04 MED ORDER — ONDANSETRON HCL 4 MG/2ML IJ SOLN
4.0000 mg | Freq: Once | INTRAMUSCULAR | Status: AC
  Administered 2014-07-04: 4 mg via INTRAVENOUS
  Filled 2014-07-04: qty 2

## 2014-07-04 MED ORDER — SODIUM CHLORIDE 0.9 % IV BOLUS (SEPSIS)
1000.0000 mL | Freq: Once | INTRAVENOUS | Status: AC
  Administered 2014-07-04: 1000 mL via INTRAVENOUS

## 2014-07-04 MED ORDER — ONDANSETRON HCL 4 MG PO TABS: 4.0000 mg | ORAL_TABLET | Freq: Four times a day (QID) | ORAL | Status: DC

## 2014-07-04 NOTE — ED Notes (Signed)
Patient here with ongoing testicle pain and now lower left sided abdominal pain with vomiting and 1 episode of diarrhea that spouse reports had blood in same. U/S negative yesterday

## 2014-07-04 NOTE — Discharge Instructions (Signed)
Pain of Unknown Etiology (Pain Without a Known Cause) °You have come to your caregiver because of pain. Pain can occur in any part of the body. Often there is not a definite cause. If your laboratory (blood or urine) work was normal and X-rays or other studies were normal, your caregiver may treat you without knowing the cause of the pain. An example of this is the headache. Most headaches are diagnosed by taking a history. This means your caregiver asks you questions about your headaches. Your caregiver determines a treatment based on your answers. Usually testing done for headaches is normal. Often testing is not done unless there is no response to medications. Regardless of where your pain is located today, you can be given medications to make you comfortable. If no physical cause of pain can be found, most cases of pain will gradually leave as suddenly as they came.  °If you have a painful condition and no reason can be found for the pain, it is important that you follow up with your caregiver. If the pain becomes worse or does not go away, it may be necessary to repeat tests and look further for a possible cause. °· Only take over-the-counter or prescription medicines for pain, discomfort, or fever as directed by your caregiver. °· For the protection of your privacy, test results cannot be given over the phone. Make sure you receive the results of your test. Ask how these results are to be obtained if you have not been informed. It is your responsibility to obtain your test results. °· You may continue all activities unless the activities cause more pain. When the pain lessens, it is important to gradually resume normal activities. Resume activities by beginning slowly and gradually increasing the intensity and duration of the activities or exercise. During periods of severe pain, bed rest may be helpful. Lie or sit in any position that is comfortable. °· Ice used for acute (sudden) conditions may be effective.  Use a large plastic bag filled with ice and wrapped in a towel. This may provide pain relief. °· See your caregiver for continued problems. Your caregiver can help or refer you for exercises or physical therapy if necessary. °If you were given medications for your condition, do not drive, operate machinery or power tools, or sign legal documents for 24 hours. Do not drink alcohol, take sleeping pills, or take other medications that may interfere with treatment. °See your caregiver immediately if you have pain that is becoming worse and not relieved by medications. °Document Released: 07/10/2001 Document Revised: 08/05/2013 Document Reviewed: 10/15/2005 °[employer]® Patient Information ©2015 [employer]. This information is not intended to replace advice given to you by your health care provider. Make sure you discuss any questions you have with your health care provider. ° °

## 2014-07-04 NOTE — ED Provider Notes (Signed)
Medical screening examination/treatment/procedure(s) were conducted as a shared visit with non-physician practitioner(s) and myself.  I personally evaluated the patient during the encounter.   EKG Interpretation None       Galilee Pierron, MD 07/04/14 1631 

## 2014-07-05 LAB — URINE CULTURE
Colony Count: NO GROWTH
Culture: NO GROWTH

## 2014-07-06 NOTE — ED Provider Notes (Signed)
CSN: [id number]     Arrival date & time 07/04/14  1046 History   First MD Initiated Contact with Patient 07/04/14 1107     Chief Complaint  Patient presents with  . Testicle Pain     (Consider location/radiation/quality/duration/timing/severity/associated sxs/prior Treatment) HPI Comments: Patient here with ongoing testicle pain and now lower left sided abdominal pain with vomiting and 1 episode of diarrhea that spouse reports had blood in same. U/S negative yesterday. Was d'c'd home to f/u with urology as outpt.   Patient is a 33 y.o. male presenting with testicular pain. The history is provided by the patient and the spouse. No language interpreter was used.  Testicle Pain This is a new problem. The current episode started more than 2 days ago. The problem occurs constantly. The problem has not changed since onset.Associated symptoms include abdominal pain. Pertinent negatives include no chest pain, no headaches and no shortness of breath. The symptoms are aggravated by walking. Nothing relieves the symptoms. He has tried rest for the symptoms. The treatment provided no relief.    Past Medical History  Diagnosis Date  . No pertinent past medical history    History reviewed. No pertinent past surgical history. No family history on file. History  Substance Use Topics  . Smoking status: Current Every Day Smoker -- 1.00 packs/day    Types: Cigarettes  . Smokeless tobacco: Not on file  . Alcohol Use: 1.2 oz/week    2 Cans of beer per week    Review of Systems  Constitutional: Negative for fever, activity change, appetite change and fatigue.  HENT: Negative for congestion, facial swelling, rhinorrhea and trouble swallowing.   Eyes: Negative for photophobia and pain.  Respiratory: Negative for cough, chest tightness and shortness of breath.   Cardiovascular: Negative for chest pain and leg swelling.  Gastrointestinal: Positive for abdominal pain. Negative for nausea, vomiting,  diarrhea and constipation.  Endocrine: Negative for polydipsia and polyuria.  Genitourinary: Positive for testicular pain. Negative for dysuria, urgency, decreased urine volume and difficulty urinating.  Musculoskeletal: Negative for back pain and gait problem.  Skin: Negative for color change, rash and wound.  Allergic/Immunologic: Negative for immunocompromised state.  Neurological: Negative for dizziness, facial asymmetry, speech difficulty, weakness, numbness and headaches.  Psychiatric/Behavioral: Negative for confusion, decreased concentration and agitation.      Allergies  Bee venom  Home Medications   Prior to Admission medications   Medication Sig Start Date End Date Taking? Authorizing Provider  ibuprofen (ADVIL,MOTRIN) 200 MG tablet Take 600 mg by mouth every 6 (six) hours as needed (pain).     Historical Provider, MD  ondansetron (ZOFRAN) 4 MG tablet Take 1 tablet (4 mg total) by mouth every 6 (six) hours. 07/04/14   Toy Cookey, MD  oxyCODONE-acetaminophen (PERCOCET/ROXICET) 5-325 MG per tablet Take 1 tablet by mouth every 4 (four) hours as needed for moderate pain or severe pain. 07/04/14   Audree Camel, MD   BP 133/91  Pulse 64  Temp(Src) 98.7 F (37.1 C) (Oral)  Resp 18  SpO2 100% Physical Exam  Constitutional: He is oriented to person, place, and time. He appears well-developed and well-nourished. No distress.  HENT:  Head: Normocephalic and atraumatic.  Mouth/Throat: No oropharyngeal exudate.  Eyes: Pupils are equal, round, and reactive to light.  Neck: Normal range of motion. Neck supple.  Cardiovascular: Normal rate, regular rhythm and normal heart sounds.  Exam reveals no gallop and no friction rub.   No murmur heard. Pulmonary/Chest: Effort normal  and breath sounds normal. No respiratory distress. He has no wheezes. He has no rales.  Abdominal: Soft. Bowel sounds are normal. He exhibits no distension and no mass. There is no tenderness. There is no  rebound and no guarding. Hernia confirmed negative in the right inguinal area and confirmed negative in the left inguinal area.    Genitourinary: Penis normal. Right testis shows no mass, no swelling and no tenderness. Right testis is descended. Cremasteric reflex is not absent on the right side. Left testis shows tenderness. Left testis shows no mass and no swelling. Left testis is descended. Cremasteric reflex is not absent on the left side. Circumcised.  Musculoskeletal: Normal range of motion. He exhibits no edema and no tenderness.  Lymphadenopathy:       Right: No inguinal adenopathy present.       Left: No inguinal adenopathy present.  Neurological: He is alert and oriented to person, place, and time.  Skin: Skin is warm and dry.  Psychiatric: He has a normal mood and affect.    ED Course  Procedures (including critical care time) Labs Review Labs Reviewed  COMPREHENSIVE METABOLIC PANEL - Abnormal; Notable for the following:    GFR calc non Af Amer 86 (*)    All other components within normal limits  URINE CULTURE  URINALYSIS, ROUTINE W REFLEX MICROSCOPIC  CBC WITH DIFFERENTIAL    Imaging Review US Scrotum  07/04/2014   CLINICAL DATA:  Left testicular and inguinal pain.  Lifting.  EXAM: SCROTAL ULTRASOUND  DOPPLER ULTRASOUND OF THE TESTICLES  TECHNIQUE: Complete ultrasound examination of the testicles, epididymis, and other scrotal structures was performed. Color and spectral Doppler ultrasound were also utilized to evaluate blood flow to the testicles.  COMPARISON:  07/03/2014.  CT of 07/04/2014  FINDINGS: Right testicle  Measurements: 4.1 x 2.1 x 2.8 cm. No mass or microlithiasis visualized.  Left testicle  Measurements: 4.2 x 2.0 x 3.1 cm. Mild testicular microlithiasis. No mass identified.  Right epididymis:  Normal in size and appearance.  Left epididymis:  Normal in size and appearance.  Hydrocele:  None visualized.  Varicocele:  None visualized.  Pulsed Doppler interrogation  of both testes demonstrates low resistance arterial and venous waveforms bilaterally.  IMPRESSION: 1. No evidence of testicular torsion, orchitis, or other explanation for left-sided pain. 2. Mild left-sided testicular microlithiasis. Current literature suggests that testicular microlithiasis is not a significant independent risk factor for development of testicular carcinoma, and that follow up imaging is not warranted in the absence of other risk factors. Monthly testicular self-examination and annual physical exams are considered appropriate surveillance. If patient has other risk factors for testicular carcinoma, then referral to Urology should be considered. (Reference: DeCastro, et al.: A 5-Year Follow up Study of Asymptomatic Men with Testicular Microlithiasis. J Urol 2008; 179:1420-1423.)   Electronically Signed   By: Jeronimo Greaves M.D.   On: 07/04/2014 14:00   [place] Art/ven Flow Abd Pelv Doppler  07/04/2014   CLINICAL DATA:  Left testicular and inguinal pain.  Lifting.  EXAM: SCROTAL ULTRASOUND  DOPPLER ULTRASOUND OF THE TESTICLES  TECHNIQUE: Complete ultrasound examination of the testicles, epididymis, and other scrotal structures was performed. Color and spectral Doppler ultrasound were also utilized to evaluate blood flow to the testicles.  COMPARISON:  07/03/2014.  CT of 07/04/2014  FINDINGS: Right testicle  Measurements: 4.1 x 2.1 x 2.8 cm. No mass or microlithiasis visualized.  Left testicle  Measurements: 4.2 x 2.0 x 3.1 cm. Mild testicular microlithiasis. No mass identified.  Right epididymis:  Normal in size and appearance.  Left epididymis:  Normal in size and appearance.  Hydrocele:  None visualized.  Varicocele:  None visualized.  Pulsed Doppler interrogation of both testes demonstrates low resistance arterial and venous waveforms bilaterally.  IMPRESSION: 1. No evidence of testicular torsion, orchitis, or other explanation for left-sided pain. 2. Mild left-sided testicular microlithiasis. Current  literature suggests that testicular microlithiasis is not a significant independent risk factor for development of testicular carcinoma, and that follow up imaging is not warranted in the absence of other risk factors. Monthly testicular self-examination and annual physical exams are considered appropriate surveillance. If patient has other risk factors for testicular carcinoma, then referral to Urology should be considered. (Reference: DeCastro, et al.: A 5-Year Follow up Study of Asymptomatic Men with Testicular Microlithiasis. J Urol 2008; 179:1420-1423.)   Electronically Signed   By: Jeronimo Greaves M.D.   On: 07/04/2014 14:00   Ct Renal Stone Study  07/04/2014   CLINICAL DATA:  Left lower quadrant abdominal pain  EXAM: CT abdomen and pelvis without contrast  TECHNIQUE: Multidetector CT imaging of the abdomen and pelvis was performed following the standard protocol without intravenous contrast  COMPARISON:  None.  FINDINGS: No radiopaque renal, ureteral, or bladder calculus is identified. No hydroureteronephrosis. No radiopaque right renal calculus. Unenhanced liver, gallbladder, spleen, pancreas, and adrenal glands are normal. No lymphadenopathy or ascites. No free air.  Normal appendix. No bowel wall thickening or focal segmental dilatation. The bladder appears normal. No acute osseous abnormality.  IMPRESSION: No acute intra-abdominal or pelvic pathology.   Electronically Signed   By: Christiana Pellant M.D.   On: 07/04/2014 12:34     EKG Interpretation None      MDM   Final diagnoses:  Testicular pain, left    Pt is a 33 y.o. male with Pmhx as above who presents with continued L sided testicular pain since 07/02/14. He was seen here for same yesterday and had nml labs, UA and testicular US. On PE, pt uncomfortable, but in NAD. He has ttp L inguinal area and L testcle, but no visualized abnormalities. CT ab pelvis nml, repeat US also nml UA and labs unremarkable. I am unsure of cause of symptoms, but  i doubt torsion, acute intraabdominal infection or emergency and I feel he is safe to f/u with urology as outpt. Spoke w/ urology who agrees that there is not additional testing that would be helpful at this point.         Toy Cookey, MD 07/06/14 1044

## 2014-07-07 LAB — NEISSERIA GONORRHOEAE, PROBE AMP: GC Probe RNA: NEGATIVE

## 2014-07-07 LAB — CHLAMYDIA TRACHOMATIS, PROBE AMP: CT Probe RNA: NEGATIVE

## 2016-05-19 IMAGING — CR DG LUMBAR SPINE COMPLETE 4+V
5 series · 5 of 5 positions shown · non-contrast
Comparison: None.

CLINICAL DATA: Low back pain, bending injury 1 week ago.

EXAM:
LUMBAR SPINE - COMPLETE 4+ VIEW

[t l-spine a.p.]
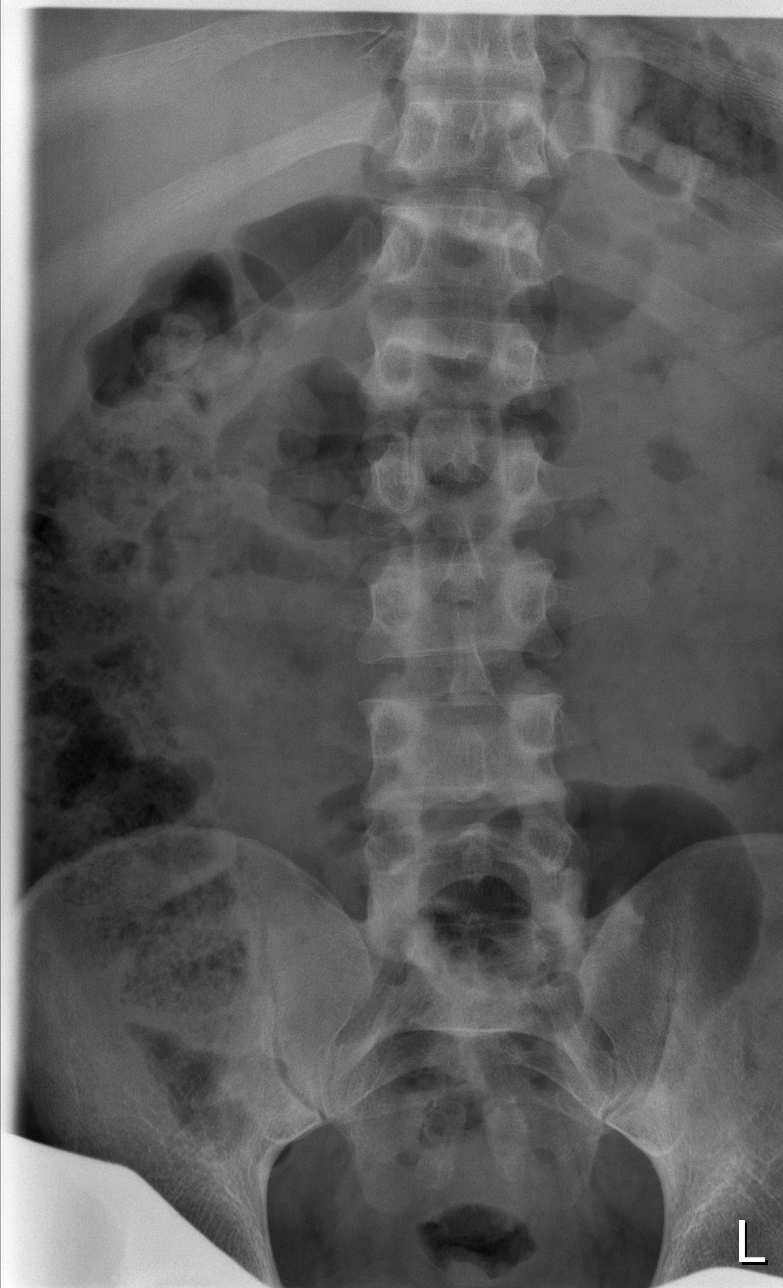

[t l-spine oblique exposure (1 of 2)]
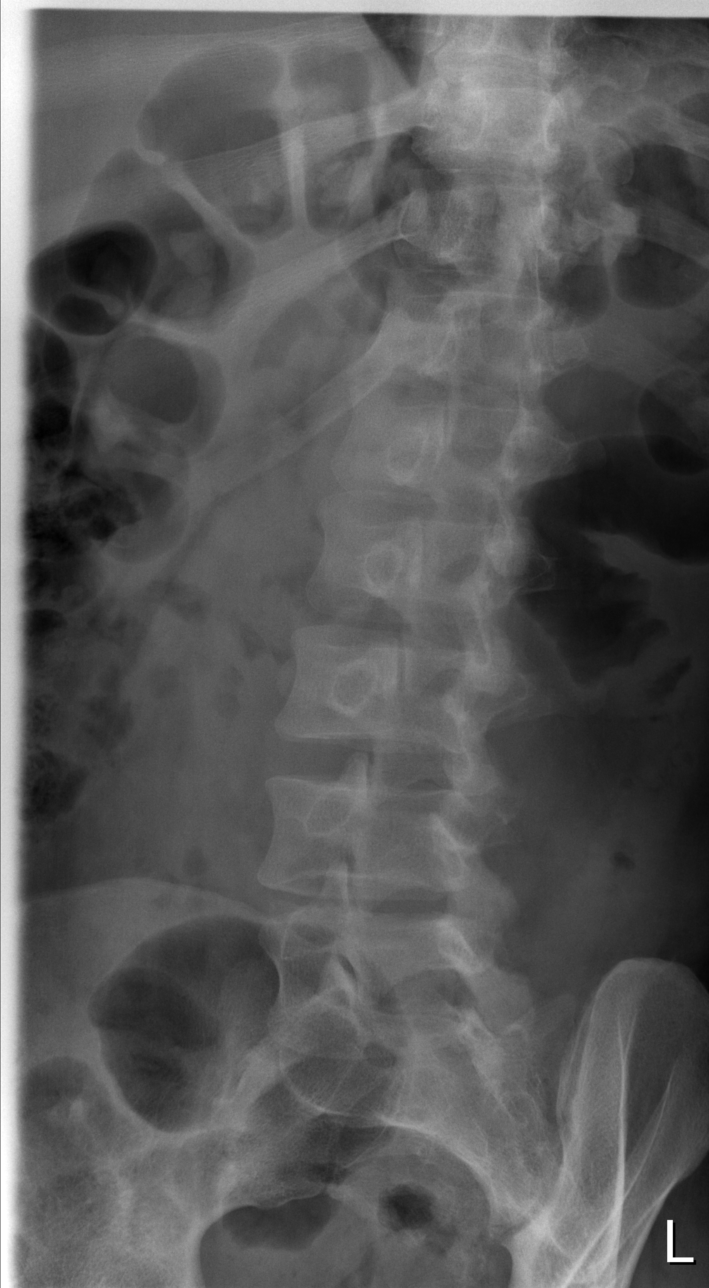

[t l-spine oblique exposure (2 of 2)]
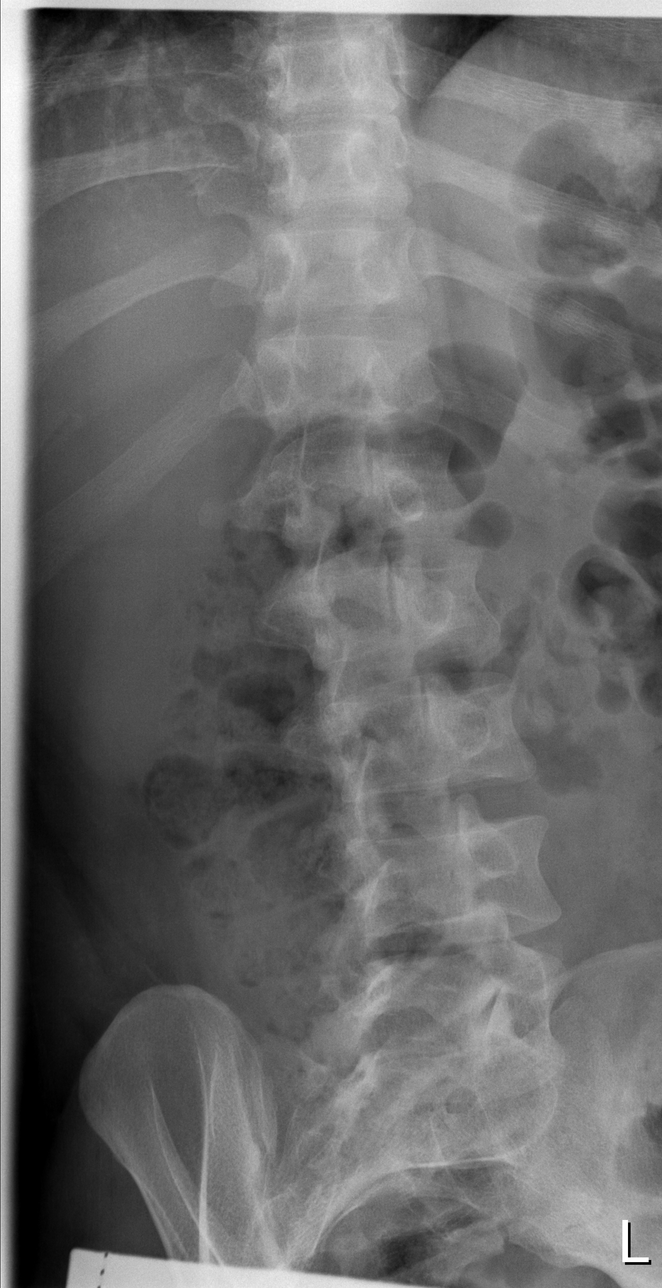

[t l-spine lat]
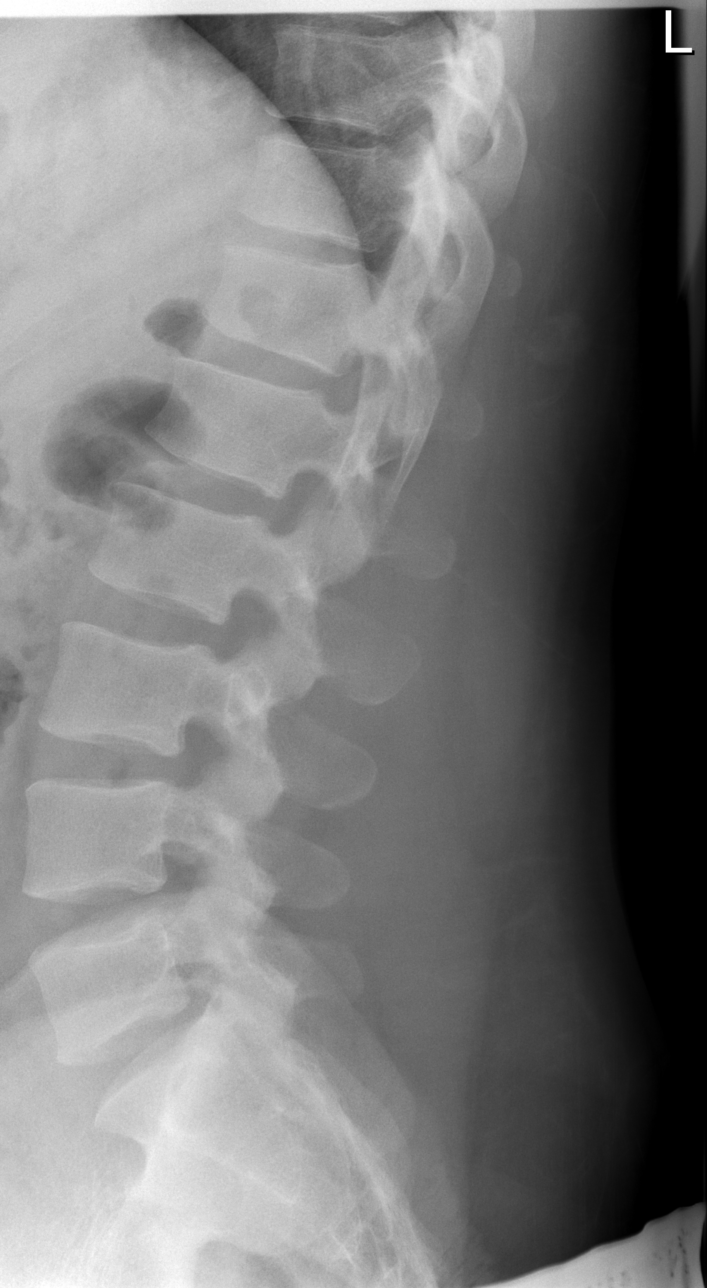

[t l-spine l5-s1 spot]
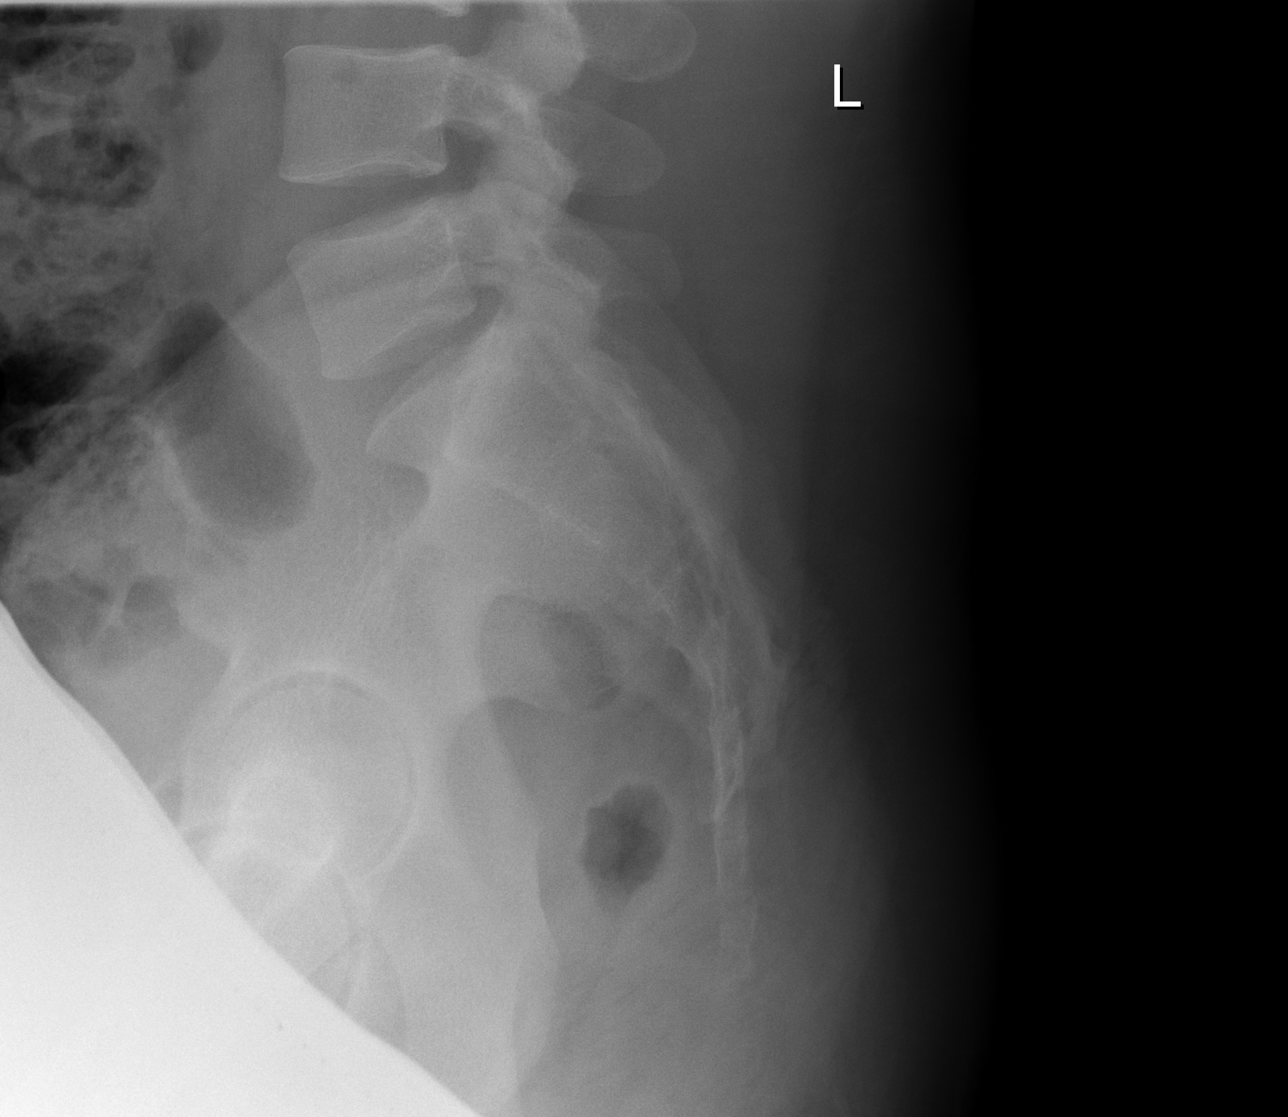

[5 of 5 positions shown; findings below may reference images not displayed]

FINDINGS: Five non rib-bearing lumbar-type vertebral bodies are intact and
aligned with maintenance of the lumbar lordosis. Intervertebral disc
heights are normal. No pars interarticularis defects. No destructive
bony lesions.

Sacroiliac joints are symmetric. Included prevertebral and
paraspinal soft tissue planes are non-suspicious.
IMPRESSION: Negative.

  By: Vashika Gasmi

## 2016-07-20 ENCOUNTER — Ambulatory Visit (HOSPITAL_BASED_OUTPATIENT_CLINIC_OR_DEPARTMENT_OTHER)
Admission: EM | Admit: 2016-07-20 | Discharge: 2016-07-21 | Disposition: A | Discharge status: Home / Self Care | Payer: Self-pay | Attending: Emergency Medicine | Admitting: Emergency Medicine

## 2016-07-20 ENCOUNTER — Encounter (HOSPITAL_BASED_OUTPATIENT_CLINIC_OR_DEPARTMENT_OTHER): Payer: Self-pay | Admitting: Emergency Medicine

## 2016-07-20 DIAGNOSIS — Y93H3 Activity, building and construction: Secondary | ICD-10-CM | POA: Insufficient documentation

## 2016-07-20 DIAGNOSIS — R0789 Other chest pain: Secondary | ICD-10-CM | POA: Insufficient documentation

## 2016-07-20 DIAGNOSIS — F1110 Opioid abuse, uncomplicated: Secondary | ICD-10-CM | POA: Insufficient documentation

## 2016-07-20 DIAGNOSIS — M549 Dorsalgia, unspecified: Secondary | ICD-10-CM | POA: Insufficient documentation

## 2016-07-20 DIAGNOSIS — S61512A Laceration without foreign body of left wrist, initial encounter: Secondary | ICD-10-CM | POA: Insufficient documentation

## 2016-07-20 DIAGNOSIS — F17210 Nicotine dependence, cigarettes, uncomplicated: Secondary | ICD-10-CM | POA: Insufficient documentation

## 2016-07-20 DIAGNOSIS — W458XXA Other foreign body or object entering through skin, initial encounter: Secondary | ICD-10-CM | POA: Insufficient documentation

## 2016-07-20 HISTORY — DX: Other psychoactive substance dependence, uncomplicated: F19.20

## 2016-07-20 HISTORY — DX: Opioid dependence, uncomplicated: F11.20

## 2016-07-20 MED ORDER — LIDOCAINE-EPINEPHRINE (PF) 2 %-1:200000 IJ SOLN: 20.0000 mL | Freq: Once | INTRAMUSCULAR | Status: DC

## 2016-07-20 MED ORDER — MORPHINE SULFATE (PF) 4 MG/ML IV SOLN
4.0000 mg | Freq: Once | INTRAVENOUS | Status: AC
  Administered 2016-07-21: 4 mg via INTRAVENOUS
  Filled 2016-07-20: qty 1

## 2016-07-20 MED ORDER — LIDOCAINE-EPINEPHRINE 2 %-1:100000 IJ SOLN
INTRAMUSCULAR | Status: AC
  Filled 2016-07-20: qty 1

## 2016-07-20 NOTE — ED Provider Notes (Signed)
[facility]   CSN: [id number] Arrival date & time: 07/20/16  2218     History   Chief Complaint Chief Complaint  Patient presents with  . Laceration    HPI Phillip Anderson is a 35 y.o. male.  Patient presents to the emergency department with chief complaint of left wrist laceration. He states that he fell while installing insulation, and cut his left wrist on a razor blade. He denies any LOC. Denies any other injuries. There are no modifying factors. He has not taken anything for his symptoms. Last tetanus shot was within 5 years. There are no other associated symptoms. He denies any numbness, weakness, or tingling.   The history is provided by the patient. No language interpreter was used.    Past Medical History:  Diagnosis Date  . No pertinent past medical history     Patient Active Problem List   Diagnosis Date Noted  . Polysubstance dependence including opioid type drug, episodic abuse (HCC) 03/25/2012  . TENDINITIS, SHOULDER, LEFT 09/14/2008  . TOBACCO ABUSE 12/19/2007  . CHEST WALL PAIN, ACUTE 12/11/2007  . BACK PAIN 09/30/2007    History reviewed. No pertinent surgical history.     Home Medications    Prior to Admission medications   Medication Sig Start Date End Date Taking? Authorizing Provider  ibuprofen (ADVIL,MOTRIN) 200 MG tablet Take 600 mg by mouth every 6 (six) hours as needed (pain).     Historical Provider, MD  ondansetron (ZOFRAN) 4 MG tablet Take 1 tablet (4 mg total) by mouth every 6 (six) hours. 07/04/14   Toy CookeyMegan Docherty, MD  oxyCODONE-acetaminophen (PERCOCET/ROXICET) 5-325 MG per tablet Take 1 tablet by mouth every 4 (four) hours as needed for moderate pain or severe pain. 07/04/14   Pricilla LovelessScott Goldston, MD    Family History No family history on file.  Social History Social History  Substance Use Topics  . Smoking status: Current Every Day Smoker    Packs/day: 1.00    Types: Cigarettes  . Smokeless tobacco: Never  Used  . Alcohol use 1.2 oz/week    2 Cans of beer per week     Allergies   Bee venom   Review of Systems Review of Systems  Skin: Positive for wound.  All other systems reviewed and are negative.    Physical Exam Updated Vital Signs BP 148/96 (BP Location: Right Arm)   Pulse 86   Temp 98.4 F (36.9 C) (Oral)   Resp 18   Ht 5\' 10"  (1.778 m)   Wt 102.1 kg   SpO2 98%   BMI 32.28 kg/m   Physical Exam  Constitutional: He is oriented to person, place, and time. He appears well-developed and well-nourished.  HENT:  Head: Normocephalic and atraumatic.  Eyes: Conjunctivae and EOM are normal.  Neck: Normal range of motion.  Cardiovascular: Normal rate and intact distal pulses.   Brisk cap refill  Pulmonary/Chest: Effort normal.  Abdominal: He exhibits no distension.  Musculoskeletal: Normal range of motion.  Neurological: He is alert and oriented to person, place, and time.  Distal sensation and strength intact  Skin: Skin is dry.  6 cm linear laceration to left wrist, bleeding through gauze despite pressure dressing  Psychiatric: He has a normal mood and affect. His behavior is normal. Judgment and thought content normal.  Nursing note and vitals reviewed.    ED Treatments / Results  Labs (all labs ordered are listed, but only abnormal results are displayed) Labs Reviewed -  No data to display  EKG  EKG Interpretation None       Radiology No results found.  Procedures Procedures (including critical care time)  Medications Ordered in ED Medications  lidocaine-EPINEPHrine (XYLOCAINE W/EPI) 2 %-1:200000 (PF) injection 20 mL (not administered)     Initial Impression / Assessment and Plan / ED Course  I have reviewed the triage vital signs and the nursing notes.  Pertinent labs & imaging results that were available during my care of the patient were reviewed by me and considered in my medical decision making (see chart for details).  Clinical Course    Patient seen by and discussed with Dr. Eudelia Bunch, who expresses concern for radial artery injury and recommends consultation with hand surgery.  I discussed the patient with Dr. Izora Ribas, who advises tx to [facility] for surgery.  Discussed patient with Dr. Criss Alvine, who accepts patient in ED to ED transfer.    Dr. Izora Ribas will need to be notified after arrival.  Final Clinical Impressions(s) / ED Diagnoses   Final diagnoses:  Wrist laceration, left, initial encounter    New Prescriptions New Prescriptions   No medications on file     Roxy Horseman, PA-C 07/21/16 0011    Roxy Horseman, PA-C 07/21/16 0013

## 2016-07-20 NOTE — ED Triage Notes (Signed)
Linear laceration to left forearm from wrist up arm. Pt states he fell on a piece of equipment with 4 razor blades on it. Pt smells of ETOH.

## 2016-07-20 NOTE — ED Notes (Signed)
Pt taken directly to room 14 and PA to bedside immediately. Wound is bleeding. Does not appear arterial. Wound dressed with pressure dressing.

## 2016-07-20 NOTE — ED Notes (Signed)
Pt has had significant additional bleeding from anterior wrist lac.  Dressing that is in place is saturated.  Applied pressure dressing and cleaned away blood.  EDPA notified of additional bleeding.

## 2016-07-21 ENCOUNTER — Encounter (HOSPITAL_COMMUNITY)
Admission: EM | Disposition: A | Discharge status: Home / Self Care | Payer: Self-pay | Source: Home / Self Care | Attending: Emergency Medicine

## 2016-07-21 ENCOUNTER — Inpatient Hospital Stay: Admit: 2016-07-21 | Payer: No Typology Code available for payment source | Admitting: General Surgery

## 2016-07-21 ENCOUNTER — Emergency Department (HOSPITAL_COMMUNITY): Payer: Self-pay | Admitting: Certified Registered"

## 2016-07-21 HISTORY — PX: ARTERY AND TENDON REPAIR: SHX5696

## 2016-07-21 LAB — CBC WITH DIFFERENTIAL/PLATELET
Basophils Absolute: 0 10*3/uL (ref 0.0–0.1)
Basophils Relative: 0 %
Eosinophils Absolute: 0 10*3/uL (ref 0.0–0.7)
Eosinophils Relative: 1 %
HCT: 43.7 % (ref 39.0–52.0)
Hemoglobin: 15.2 g/dL (ref 13.0–17.0)
Lymphocytes Relative: 26 %
Lymphs Abs: 2 10*3/uL (ref 0.7–4.0)
MCH: 31.7 pg (ref 26.0–34.0)
MCHC: 34.8 g/dL (ref 30.0–36.0)
MCV: 91.2 fL (ref 78.0–100.0)
Monocytes Absolute: 0.5 10*3/uL (ref 0.1–1.0)
Monocytes Relative: 7 %
Neutro Abs: 4.9 10*3/uL (ref 1.7–7.7)
Neutrophils Relative %: 66 %
Platelets: 198 10*3/uL (ref 150–400)
RBC: 4.79 MIL/uL (ref 4.22–5.81)
RDW: 12.6 % (ref 11.5–15.5)
WBC: 7.5 10*3/uL (ref 4.0–10.5)

## 2016-07-21 LAB — BASIC METABOLIC PANEL
Anion gap: 10 (ref 5–15)
BUN: 6 mg/dL (ref 6–20)
CO2: 25 mmol/L (ref 22–32)
Calcium: 9.2 mg/dL (ref 8.9–10.3)
Chloride: 104 mmol/L (ref 101–111)
Creatinine, Ser: 1.01 mg/dL (ref 0.61–1.24)
GFR calc Af Amer: >60 mL/min (ref >60)
GFR calc non Af Amer: >60 mL/min (ref >60)
Glucose, Bld: 96 mg/dL (ref 65–99)
Potassium: 3.6 mmol/L (ref 3.5–5.1)
Sodium: 139 mmol/L (ref 135–145)

## 2016-07-21 SURGERY — ARTERY AND TENDON REPAIR
Anesthesia: General | Site: Arm Lower | Laterality: Left

## 2016-07-21 MED ORDER — LORAZEPAM 2 MG/ML IJ SOLN
1.0000 mg | Freq: Once | INTRAMUSCULAR | Status: AC
  Administered 2016-07-21: 1 mg via INTRAVENOUS
  Filled 2016-07-21: qty 1

## 2016-07-21 MED ORDER — BUPIVACAINE HCL (PF) 0.25 % IJ SOLN
INTRAMUSCULAR | Status: DC | PRN
  Administered 2016-07-21: 10 mL

## 2016-07-21 MED ORDER — FENTANYL CITRATE (PF) 250 MCG/5ML IJ SOLN
INTRAMUSCULAR | Status: DC | PRN
  Administered 2016-07-21: 100 ug via INTRAVENOUS
  Administered 2016-07-21 (×2): 50 ug via INTRAVENOUS

## 2016-07-21 MED ORDER — HYDROCODONE-ACETAMINOPHEN 7.5-325 MG PO TABS: 1.0000 | ORAL_TABLET | Freq: Once | ORAL | Status: DC | PRN

## 2016-07-21 MED ORDER — PROMETHAZINE HCL 25 MG/ML IJ SOLN: 6.2500 mg | INTRAMUSCULAR | Status: DC | PRN

## 2016-07-21 MED ORDER — EPHEDRINE SULFATE 50 MG/ML IJ SOLN
INTRAMUSCULAR | Status: DC | PRN
  Administered 2016-07-21: 5 mg via INTRAVENOUS
  Administered 2016-07-21: 10 mg via INTRAVENOUS

## 2016-07-21 MED ORDER — CEFAZOLIN SODIUM-DEXTROSE 2-3 GM-% IV SOLR
INTRAVENOUS | Status: DC | PRN
  Administered 2016-07-21: 2 g via INTRAVENOUS

## 2016-07-21 MED ORDER — HYDROMORPHONE HCL 1 MG/ML IJ SOLN: 0.2500 mg | INTRAMUSCULAR | Status: DC | PRN

## 2016-07-21 MED ORDER — MIDAZOLAM HCL 2 MG/2ML IJ SOLN
INTRAMUSCULAR | Status: DC | PRN
  Administered 2016-07-21: 2 mg via INTRAVENOUS

## 2016-07-21 MED ORDER — LACTATED RINGERS IV SOLN
INTRAVENOUS | Status: DC | PRN
  Administered 2016-07-21: 02:00:00 via INTRAVENOUS

## 2016-07-21 MED ORDER — PROPOFOL 10 MG/ML IV BOLUS
INTRAVENOUS | Status: DC | PRN
  Administered 2016-07-21: 200 mg via INTRAVENOUS
  Administered 2016-07-21: 30 mg via INTRAVENOUS

## 2016-07-21 MED ORDER — LIDOCAINE HCL (CARDIAC) 20 MG/ML IV SOLN
INTRAVENOUS | Status: DC | PRN
  Administered 2016-07-21: 60 mg via INTRATRACHEAL

## 2016-07-21 MED ORDER — 0.9 % SODIUM CHLORIDE (POUR BTL) OPTIME
TOPICAL | Status: DC | PRN
  Administered 2016-07-21: 1000 mL

## 2016-07-21 MED ORDER — SUCCINYLCHOLINE CHLORIDE 20 MG/ML IJ SOLN
INTRAMUSCULAR | Status: DC | PRN
  Administered 2016-07-21: 100 mg via INTRAVENOUS

## 2016-07-21 MED ORDER — TETANUS-DIPHTH-ACELL PERTUSSIS 5-2.5-18.5 LF-MCG/0.5 IM SUSP: 0.5000 mL | Freq: Once | INTRAMUSCULAR | Status: DC

## 2016-07-21 MED ORDER — KETOROLAC TROMETHAMINE 30 MG/ML IJ SOLN: 30.0000 mg | Freq: Once | INTRAMUSCULAR | Status: DC | PRN

## 2016-07-21 MED ORDER — BUPIVACAINE HCL (PF) 0.25 % IJ SOLN
INTRAMUSCULAR | Status: AC
  Filled 2016-07-21: qty 30

## 2016-07-21 SURGICAL SUPPLY — 34 items
BANDAGE ACE 4X5 VEL STRL LF (GAUZE/BANDAGES/DRESSINGS) ×1 IMPLANT
BANDAGE ELASTIC 3 VELCRO ST LF (GAUZE/BANDAGES/DRESSINGS) IMPLANT
BNDG COHESIVE 1X5 TAN STRL LF (GAUZE/BANDAGES/DRESSINGS) IMPLANT
BNDG GAUZE ELAST 4 BULKY (GAUZE/BANDAGES/DRESSINGS) ×1 IMPLANT
CORDS BIPOLAR (ELECTRODE) ×2 IMPLANT
COVER SURGICAL LIGHT HANDLE (MISCELLANEOUS) ×2 IMPLANT
CUFF TOURNIQUET SINGLE 18IN (TOURNIQUET CUFF) IMPLANT
DRAPE SURG 17X23 STRL (DRAPES) ×2 IMPLANT
GAUZE SPONGE 4X4 12PLY STRL (GAUZE/BANDAGES/DRESSINGS) ×1 IMPLANT
GAUZE XEROFORM 1X8 LF (GAUZE/BANDAGES/DRESSINGS) ×1 IMPLANT
GLOVE BIOGEL M 8.0 STRL (GLOVE) ×2 IMPLANT
GOWN STRL REUS W/ TWL XL LVL3 (GOWN DISPOSABLE) ×1 IMPLANT
GOWN STRL REUS W/TWL XL LVL3 (GOWN DISPOSABLE) ×4
KIT BASIN OR (CUSTOM PROCEDURE TRAY) ×2 IMPLANT
KIT ROOM TURNOVER OR (KITS) ×2 IMPLANT
NDL HYPO 25GX1X1/2 BEV (NEEDLE) IMPLANT
NEEDLE HYPO 25GX1X1/2 BEV (NEEDLE) ×2 IMPLANT
NS IRRIG 1000ML POUR BTL (IV SOLUTION) ×2 IMPLANT
PACK ORTHO EXTREMITY (CUSTOM PROCEDURE TRAY) ×2 IMPLANT
PAD ARMBOARD 7.5X6 YLW CONV (MISCELLANEOUS) ×4 IMPLANT
PAD CAST 4YDX4 CTTN HI CHSV (CAST SUPPLIES) IMPLANT
PADDING CAST COTTON 4X4 STRL (CAST SUPPLIES)
SOAP 2 % CHG 4 OZ (WOUND CARE) ×2 IMPLANT
SPECIMEN JAR SMALL (MISCELLANEOUS) ×2 IMPLANT
SPONGE SCRUB IODOPHOR (GAUZE/BANDAGES/DRESSINGS) ×2 IMPLANT
SUCTION FRAZIER HANDLE 10FR (MISCELLANEOUS) ×1
SUCTION TUBE FRAZIER 10FR DISP (MISCELLANEOUS) IMPLANT
SUT CHROMIC 6 0 PS 4 (SUTURE) ×1 IMPLANT
SUT MNCRL AB 4-0 PS2 18 (SUTURE) ×2 IMPLANT
SYR CONTROL 10ML LL (SYRINGE) ×1 IMPLANT
TOWEL OR 17X24 6PK STRL BLUE (TOWEL DISPOSABLE) ×2 IMPLANT
TOWEL OR 17X26 10 PK STRL BLUE (TOWEL DISPOSABLE) ×2 IMPLANT
TUBE CONNECTING 12X1/4 (SUCTIONS) ×1 IMPLANT
UNDERPAD 30X30 (UNDERPADS AND DIAPERS) ×2 IMPLANT

## 2016-07-21 NOTE — Op Note (Signed)
NAME:  Phillip Anderson, Phillip Anderson               ACCOUNT NO.:  [id number]  MEDICAL RECORD NO.:  [id number]  LOCATION:  [facility]                         FACILITY:  [facility]  PHYSICIAN:  Johnette Abraham, MD    DATE OF BIRTH:  [birth year]  DATE OF PROCEDURE:  07/21/2016 DATE OF DISCHARGE:  07/21/2016                              OPERATIVE REPORT   PREOPERATIVE DIAGNOSIS:  Complex lacerations of left wrist.  POSTOPERATIVE DIAGNOSIS:  Complex lacerations of left wrist.  PROCEDURES: 1. Exploration of wound of the left wrist including the radial artery,     the flexor tendon sheath, median nerve. 2. Cauterization of small veins. 3. Closure of complex wound, measuring 20 cm.  INDICATIONS:  [name] is a 35 year old male, presented to [facility] with razor blade injury to the left wrist.  The ER physician there felt that he had an arterial injury, was unable to control the bleeding.  His wrist was wrapped and he was transferred to [facility] for emergent care.  The patient on initial exam, had some altered sensation, but this may have been due to the tight dressing to control the bleeding.  He was able to move, flex and extend all of his digits and thumb without difficulty.  Risks, benefits and alternatives of the surgery were discussed with him including nerve, vessel and tendon repair.  He agreed and consent was obtained.  DESCRIPTION OF PROCEDURE:  The patient was taken to the operating room and placed supine on the operating table, general anesthesia was administered without difficulty.  The left upper extremity was exsanguinated and tourniquet was inflated.  The extremity was prepped and draped in normal sterile fashion.  Exploration of the wound revealed several linear lacerations, approximately 8 cm each long on the volar wrist overlying the FCR tendon.  Essentially, there was a separate laceration overlying the dorsolateral thenar eminence, this was superficial.  All these wounds were  thoroughly irrigated with irrigation solution.  The radial artery was then explored.  It was isolated throughout the length of the wound and actually little bit more proximally and distally, and it was in continuity without any injury. The wound did appear to pierce the deep forearm fascia, this was opened. The median nerve was visualized and without injury.  The FCR tendon was the one in the immediate wound that was visualized, it was in continuity.  Gentle range of motion of the other digits did not reveal any staining of the flexor tendon sheaths there.  Therefore, no injury. Additional irrigation was then performed.  Next, two of the more superficial lacerations on the wrist were closed with running 6-0 suture.  Next, the deeper wound was closed in layers with 4-0 Monocryl for nice cosmetic result.  Tourniquet was released.  Hemostasis was controlled with bipolar.  Several small bleeding veins around the wrist were cauterized. There was no arterial injury or no pulsatile bleeding.  Additional irrigation was performed and then the sterile dressing was applied.  The patient tolerated the procedure well, was taken to the recovery room in stable condition.     Johnette Abraham, MD     HCC/MEDQ  D:  07/21/2016  T:  07/21/2016  Job:  [id number]

## 2016-07-21 NOTE — Anesthesia Preprocedure Evaluation (Signed)
Anesthesia Evaluation  Patient identified by MRN, date of birth, ID band Patient awake    Reviewed: Allergy & Precautions, NPO status , Patient's Chart, lab work & pertinent test results  Airway Mallampati: II  TM Distance: >3 FB Neck ROM: Full    Dental  (+) Dental Advisory Given   Pulmonary Current Smoker,    breath sounds clear to auscultation       Cardiovascular negative cardio ROS   Rhythm:Regular Rate:Normal     Neuro/Psych negative neurological ROS     GI/Hepatic negative GI ROS, Neg liver ROS,   Endo/Other  negative endocrine ROS  Renal/GU negative Renal ROS     Musculoskeletal   Abdominal   Peds  Hematology   Anesthesia Other Findings   Reproductive/Obstetrics                             Anesthesia Physical Anesthesia Plan  ASA: II and emergent  Anesthesia Plan: General   Post-op Pain Management:    Induction: Intravenous  Airway Management Planned: Oral ETT  Additional Equipment:   Intra-op Plan:   Post-operative Plan: Extubation in OR  Informed Consent: I have reviewed the patients History and Physical, chart, labs and discussed the procedure including the risks, benefits and alternatives for the proposed anesthesia with the patient or authorized representative who has indicated his/her understanding and acceptance.   Dental advisory given  Plan Discussed with: CRNA  Anesthesia Plan Comments:         Anesthesia Quick Evaluation

## 2016-07-21 NOTE — ED Provider Notes (Signed)
Medical screening examination/treatment/procedure(s) were conducted as a shared visit with non-physician practitioner(s) and myself.  I personally evaluated the patient during the encounter. Briefly, the patient is a 35 y.o. male left wrist laceration with high likelihood of arterial injury. Unable to obtain hemostasis. Spoke with hand surgery who requested ED to ED transfer and will evaluate the patient.      Nira ConnPedro Eduardo Awanda Wilcock, MD 07/21/16 0030

## 2016-07-21 NOTE — Transfer of Care (Signed)
Immediate Anesthesia Transfer of Care Note  Patient: Phillip KusterJames C Palmer  Procedure(s) Performed: Procedure(s): LEFT ARM EXPLORATION (Left)  Patient Location: PACU  Anesthesia Type:General  Level of Consciousness: awake, alert  and oriented  Airway & Oxygen Therapy: Patient Spontanous Breathing and Patient connected to nasal cannula oxygen  Post-op Assessment: Report given to RN and Post -op Vital signs reviewed and stable  Post vital signs: Reviewed and stable  Last Vitals:  Vitals:   07/21/16 0122 07/21/16 0320  BP: 137/96 118/88  Pulse: 78 92  Resp: 18 (!) 22  Temp: 37 C 36.1 C    Last Pain:  Vitals:   07/21/16 0132  TempSrc:   PainSc: 8          Complications: No apparent anesthesia complications

## 2016-07-21 NOTE — ED Provider Notes (Signed)
[facility] Concern for radial artery injury. Currently his wrist is wrapped with coban and gauze. No current bleeding or soaking through bandage. Going to OR with hand surgery. Appears stable at this time   Pricilla LovelessScott Bartlett Enke, MD 07/21/16 [phone]

## 2016-07-21 NOTE — ED Notes (Signed)
[facility] pt states he fell onto razor tool used to cut insulation.  Pt A & O, appears very anxious, pt states his daughter was shot a few months ago and died in this facility, this is adding stress to situation.  Dressing in place, clean and dry. Dr. Gwenlyn FudgeGoldstein in room, Dr. Izora Ribasoley to be paged.

## 2016-07-21 NOTE — H&P (Signed)
Reason for Consult:Wrist Laceration Referring Physician: ER  CC:I was stupid and cut my wrist  HPI:  Phillip Anderson is an 34 y.o. right handed male who presents with  Laceration and bleeding from wound to L wrist.  Denies numbness, denies loss of function.  Pt presented to [facility], active bleeding, uncontollable, transferred here emergently.       Pain is rated at    8/10 and is described as sharp.  Pain is constant.  Pain is made better by rest/immobilization, worse with motion.   Associated signs/symptoms: denies Previous treatment:    Past Medical History:  Diagnosis Date  . No pertinent past medical history   . Polysubstance dependence including opioid type drug, episodic abuse ([place])     History reviewed. No pertinent surgical history.  No family history on file.  Social History:  reports that he has been smoking Cigarettes.  He has been smoking about 1.00 pack per day. He has never used smokeless tobacco. He reports that he drinks about 1.2 oz of alcohol per week . He reports that he does not use drugs.  Allergies:  Allergies  Allergen Reactions  . Bee Venom Swelling    Medications: I have reviewed the patient's current medications.  Results for orders placed or performed during the hospital encounter of 07/20/16 (from the past 48 hour(s))  CBC with Differential     Status: None   Collection Time: 07/21/16 12:05 AM  Result Value Ref Range   WBC 7.5 4.0 - 10.5 K/uL   RBC 4.79 4.22 - 5.81 MIL/uL   Hemoglobin 15.2 13.0 - 17.0 g/dL   HCT 43.7 39.0 - 52.0 %   MCV 91.2 78.0 - 100.0 fL   MCH 31.7 26.0 - 34.0 pg   MCHC 34.8 30.0 - 36.0 g/dL   RDW 12.6 11.5 - 15.5 %   Platelets 198 150 - 400 K/uL   Neutrophils Relative % 66 %   Neutro Abs 4.9 1.7 - 7.7 K/uL   Lymphocytes Relative 26 %   Lymphs Abs 2.0 0.7 - 4.0 K/uL   Monocytes Relative 7 %   Monocytes Absolute 0.5 0.1 - 1.0 K/uL   Eosinophils Relative 1 %   Eosinophils Absolute 0.0 0.0 - 0.7 K/uL   Basophils  Relative 0 %   Basophils Absolute 0.0 0.0 - 0.1 K/uL  Basic metabolic panel     Status: None   Collection Time: 07/21/16 12:05 AM  Result Value Ref Range   Sodium 139 135 - 145 mmol/L   Potassium 3.6 3.5 - 5.1 mmol/L   Chloride 104 101 - 111 mmol/L   CO2 25 22 - 32 mmol/L   Glucose, Bld 96 65 - 99 mg/dL   BUN 6 6 - 20 mg/dL   Creatinine, Ser 1.01 0.61 - 1.24 mg/dL   Calcium 9.2 8.9 - 10.3 mg/dL   GFR calc non Af Amer >60 >60 mL/min   GFR calc Af Amer >60 >60 mL/min    Comment: (NOTE) The eGFR has been calculated using the CKD EPI equation. This calculation has not been validated in all clinical situations. eGFR's persistently <60 mL/min signify possible Chronic Kidney Disease.    Anion gap 10 5 - 15    No results found.  Pertinent items are noted in HPI. Temp:  [98.4 F (36.9 C)-98.6 F (37 C)] 98.6 F (37 C) (09/23 0122) Pulse Rate:  [78-86] 78 (09/23 0122) Resp:  [18] 18 (09/23 0122) BP: (137-154)/(96-104) 137/96 (09/23 0122)  SpO2:  [98 %-100 %] 100 % (09/23 0122) Weight:  [102.1 kg (225 lb)] 102.1 kg (225 lb) (09/22 2231) General appearance: alert and cooperative Resp: clear to auscultation bilaterally Cardio: regular rate and rhythm GI: soft, non-tender; bowel sounds normal; no masses,  no organomegaly Extremities: RUE - wnl; LUE with ~8cm laceration volar wrist, with active bleeding, able to flex extend all digits, n/v grossly intact   Assessment: Laceration of L wrist, ? Arterial bleeding Plan: Needs exploration and repair I have discussed this treatment plan in detail with patient and family, including the risks of the  surgery, the benefits and the alternatives.  The patient  understands that additional treatment may be necessary.  Phillip Anderson 07/21/2016, 2:08 AM

## 2016-07-21 NOTE — Anesthesia Procedure Notes (Signed)
Procedure Name: Intubation Date/Time: 07/21/2016 2:24 AM Performed by: Isidore MoosPAXTON, Anastasio Wogan A Pre-anesthesia Checklist: Patient identified, Suction available and Patient being monitored Patient Re-evaluated:Patient Re-evaluated prior to inductionOxygen Delivery Method: Circle system utilized Preoxygenation: Pre-oxygenation with 100% oxygen Intubation Type: IV induction Laryngoscope Size: Miller and 2 Grade View: Grade I Tube type: Oral Tube size: 7.5 mm Number of attempts: 1 Airway Equipment and Method: Stylet Placement Confirmation: ETT inserted through vocal cords under direct vision,  positive ETCO2 and breath sounds checked- equal and bilateral Secured at: 22 cm Tube secured with: Tape Dental Injury: Teeth and Oropharynx as per pre-operative assessment

## 2016-07-21 NOTE — Anesthesia Postprocedure Evaluation (Signed)
Anesthesia Post Note  Patient: [name]  Procedure(s) Performed: Procedure(s) (LRB): LEFT ARM EXPLORATION (Left)  Patient location during evaluation: PACU Anesthesia Type: General Level of consciousness: awake and alert Pain management: pain level controlled Vital Signs Assessment: post-procedure vital signs reviewed and stable Respiratory status: spontaneous breathing, nonlabored ventilation, respiratory function stable and patient connected to nasal cannula oxygen Cardiovascular status: blood pressure returned to baseline and stable Postop Assessment: no signs of nausea or vomiting Anesthetic complications: no    Last Vitals:  Vitals:   07/21/16 0344 07/21/16 0345  BP: (!) 133/97 (!) 133/97  Pulse:    Resp: 20 20  Temp: 36.7 C     Last Pain:  Vitals:   07/21/16 0132  TempSrc:   PainSc: 8                  Kennieth RadFitzgerald, Jaylina Ramdass E

## 2016-07-21 NOTE — Discharge Instructions (Signed)
Laceration Care, Adult A laceration is a cut that goes through all of the layers of the skin and into the tissue that is right under the skin. Some lacerations heal on their own. Others need to be closed with stitches (sutures), staples, skin adhesive strips, or skin glue. Proper laceration care minimizes the risk of infection and helps the laceration to heal better. HOW TO CARE FOR YOUR LACERATION If sutures or staples were used:  Keep the wound clean and dry.  If you were given a bandage (dressing), you should change it at least one time per day or as told by your health care provider. You should also change it if it becomes wet or dirty.  Keep the wound completely dry for the first 24 hours or as told by your health care provider. After that time, you may shower or bathe. However, make sure that the wound is not soaked in water until after the sutures or staples have been removed.  Clean the wound one time each day or as told by your health care provider:  Wash the wound with soap and water.  Rinse the wound with water to remove all soap.  Pat the wound dry with a clean towel. Do not rub the wound.  After cleaning the wound, apply a thin layer of antibiotic ointmentas told by your health care provider. This will help to prevent infection and keep the dressing from sticking to the wound.  Have the sutures or staples removed as told by your health care provider. If skin adhesive strips were used:  Keep the wound clean and dry.  If you were given a bandage (dressing), you should change it at least one time per day or as told by your health care provider. You should also change it if it becomes dirty or wet.  Do not get the skin adhesive strips wet. You may shower or bathe, but be careful to keep the wound dry.  If the wound gets wet, pat it dry with a clean towel. Do not rub the wound.  Skin adhesive strips fall off on their own. You may trim the strips as the wound heals. Do not  remove skin adhesive strips that are still stuck to the wound. They will fall off in time. If skin glue was used:  Try to keep the wound dry, but you may briefly wet it in the shower or bath. Do not soak the wound in water, such as by swimming.  After you have showered or bathed, gently pat the wound dry with a clean towel. Do not rub the wound.  Do not do any activities that will make you sweat heavily until the skin glue has fallen off on its own.  Do not apply liquid, cream, or ointment medicine to the wound while the skin glue is in place. Using those may loosen the film before the wound has healed.  If you were given a bandage (dressing), you should change it at least one time per day or as told by your health care provider. You should also change it if it becomes dirty or wet.  If a dressing is placed over the wound, be careful not to apply tape directly over the skin glue. Doing that may cause the glue to be pulled off before the wound has healed.  Do not pick at the glue. The skin glue usually remains in place for 5-10 days, then it falls off of the skin. General Instructions  Take over-the-counter and  prescription medicines only as told by your health care provider.  If you were prescribed an antibiotic medicine or ointment, take or apply it as told by your doctor. Do not stop using it even if your condition improves.  To help prevent scarring, make sure to cover your wound with sunscreen whenever you are outside after stitches are removed, after adhesive strips are removed, or when glue remains in place and the wound is healed. Make sure to wear a sunscreen of at least 30 SPF.  Do not scratch or pick at the wound.  Keep all follow-up visits as told by your health care provider. This is important.  Check your wound every day for signs of infection. Watch for:  Redness, swelling, or pain.  Fluid, blood, or pus.  Raise (elevate) the injured area above the level of your heart  while you are sitting or lying down, if possible. SEEK MEDICAL CARE IF:  You received a tetanus shot and you have swelling, severe pain, redness, or bleeding at the injection site.  You have a fever.  A wound that was closed breaks open.  You notice a bad smell coming from your wound or your dressing.  You notice something coming out of the wound, such as wood or glass.  Your pain is not controlled with medicine.  You have increased redness, swelling, or pain at the site of your wound.  You have fluid, blood, or pus coming from your wound.  You notice a change in the color of your skin near your wound.  You need to change the dressing frequently due to fluid, blood, or pus draining from the wound.  You develop a new rash.  You develop numbness around the wound. SEEK IMMEDIATE MEDICAL CARE IF:  You develop severe swelling around the wound.  Your pain suddenly increases and is severe.  You develop painful lumps near the wound or on skin that is anywhere on your body.  You have a red streak going away from your wound.  The wound is on your hand or foot and you cannot properly move a finger or toe.  The wound is on your hand or foot and you notice that your fingers or toes look pale or bluish.   This information is not intended to replace advice given to you by your health care provider. Make sure you discuss any questions you have with your health care provider.   Document Released: 10/15/2005 Document Revised: 03/01/2015 Document Reviewed: 10/11/2014 Elsevier Interactive Patient Education 2016 [employer]  Delayed Wound Closure Sometimes, your health care provider will decide to delay closing a wound for several days. This is done when the wound is badly bruised, dirty, or when it has been several hours since the injury happened. By delaying the closure of your wound, the risk of infection is reduced. Wounds that are closed in 3-7 days after being cleaned up and dressed  heal just as well as those that are closed right away. HOME CARE INSTRUCTIONS  Rest and elevate the injured area until the pain and swelling are gone.  Have your wound checked as instructed by your health care provider. SEEK MEDICAL CARE IF:  You develop unusual or increased swelling or redness around the wound.  You have increasing pain or tenderness.  There is increasing fluid (drainage) or a bad smelling drainage coming from the wound.   This information is not intended to replace advice given to you by your health care provider. Make sure you discuss  any questions you have with your health care provider.   Document Released: 10/15/2005 Document Revised: 10/20/2013 Document Reviewed: 04/14/2013 Elsevier Interactive Patient Education 2016 [employer]. Discharge Instructions:  Keep your dressing clean, dry and in place until instructed to remove by Dr. Izora Ribas.  If the dressing becomes dirty or wet call the office for instructions during business hours. Elevate the extremity to help with swelling, this will also help with any discomfort. Take your medication as prescribed. No lifting with the injured  extremity. If you feel that the dressing is too tight, you may loosen it, but keep it on; finger tips should be pink; if there is a concern, call the office. [phone] Ice may be used if the injury is a fracture, do not apply ice directly to the skin. Please call the office on the next business day after discharge to arrange a follow up appointment.  Call [phone] between the hours of 9am - 5pm M-Th or 9am - 1pm on Fri. For most hand injuries and/or conditions, you may return to work using the uninjured hand (one handed duty) within 24-72 hours.  A detailed note will be provided to you at your follow up appointment or may contact the office prior to your follow up.

## 2016-07-23 ENCOUNTER — Encounter (HOSPITAL_COMMUNITY): Payer: Self-pay | Admitting: General Surgery

## 2016-08-15 ENCOUNTER — Emergency Department (HOSPITAL_BASED_OUTPATIENT_CLINIC_OR_DEPARTMENT_OTHER)
Admission: EM | Admit: 2016-08-15 | Discharge: 2016-08-15 | Disposition: A | Discharge status: Home / Self Care | Payer: Self-pay | Attending: Emergency Medicine | Admitting: Emergency Medicine

## 2016-08-15 ENCOUNTER — Encounter (HOSPITAL_BASED_OUTPATIENT_CLINIC_OR_DEPARTMENT_OTHER): Payer: Self-pay | Admitting: *Deleted

## 2016-08-15 ENCOUNTER — Emergency Department (HOSPITAL_BASED_OUTPATIENT_CLINIC_OR_DEPARTMENT_OTHER): Payer: Self-pay

## 2016-08-15 DIAGNOSIS — F17210 Nicotine dependence, cigarettes, uncomplicated: Secondary | ICD-10-CM | POA: Insufficient documentation

## 2016-08-15 DIAGNOSIS — M79602 Pain in left arm: Secondary | ICD-10-CM | POA: Insufficient documentation

## 2016-08-15 MED ORDER — IBUPROFEN 400 MG PO TABS
600.0000 mg | ORAL_TABLET | Freq: Once | ORAL | Status: AC
  Administered 2016-08-15: 600 mg via ORAL
  Filled 2016-08-15: qty 1

## 2016-08-15 MED ORDER — ACETAMINOPHEN 325 MG PO TABS
650.0000 mg | ORAL_TABLET | Freq: Once | ORAL | Status: AC
  Administered 2016-08-15: 650 mg via ORAL
  Filled 2016-08-15: qty 2

## 2016-08-15 NOTE — ED Triage Notes (Signed)
Pt c/o left arm pain x 1 week, recent surgery to left arm 9/23

## 2016-08-15 NOTE — Discharge Instructions (Signed)
Rest, Ice intermittently (in the first 24-48 hours), Gentle compression with an Ace wrap, and elevate (Limb above the level of the heart)   Take up to 800mg  of ibuprofen (that is usually 4 over the counter pills)  3 times a day for 5 days. Take with food.  Do not hesitate to return to the emergency department for any new, worsening or concerning symptoms.

## 2016-08-15 NOTE — ED Provider Notes (Signed)
[facility] Provider Note   CSN: [id number] Arrival date & time: 08/15/16  1721     History   Chief Complaint Chief Complaint  Patient presents with  . Arm Pain    HPI   Blood pressure 148/100, pulse 69, temperature 97.9 F (36.6 C), temperature source Oral, resp. rate 16, height 5\' 10"  (1.778 m), weight 97.5 kg, SpO2 100 %.  Phillip Anderson is a 35 y.o. male complaining of 10 out of 10 pain to left arm. Patient had unintentional laceration on September 23, was thought that he might of had an arterial blade and was transferred to [facility] where hand surgeon Dr. Izora Ribas and exploratory surgery and didn't find any significant abnormalities on his exam. He did not follow-up with Dr. Izora Ribas after the surgery, states that he returned to work quicker than recommended. States that the pain started 2 days ago states that initially it was proximal and on the ulnar aspect it's moved into the wrist today. He states that he feels like he was swollen in the past. He hasn't taken any pain medication, rates his pain at 10 out of 10.  Past Medical History:  Diagnosis Date  . No pertinent past medical history   . Polysubstance dependence including opioid type drug, episodic abuse [facility])     Patient Active Problem List   Diagnosis Date Noted  . Polysubstance dependence including opioid type drug, episodic abuse ([facility]) 03/25/2012  . TENDINITIS, SHOULDER, LEFT 09/14/2008  . TOBACCO ABUSE 12/19/2007  . CHEST WALL PAIN, ACUTE 12/11/2007  . BACK PAIN 09/30/2007    Past Surgical History:  Procedure Laterality Date  . ARTERY AND TENDON REPAIR Left 07/21/2016   Procedure: LEFT ARM EXPLORATION;  Surgeon: Knute Neu, MD;  Location: [facility] OR;  Service: Orthopedics;  Laterality: Left;       Home Medications    Prior to Admission medications   Not on File    Family History History reviewed. No pertinent family history.  Social History Social History  Substance Use Topics  . Smoking  status: Current Every Day Smoker    Packs/day: 1.00    Types: Cigarettes  . Smokeless tobacco: Never Used  . Alcohol use 1.2 oz/week    2 Cans of beer per week     Allergies   Bee venom   Review of Systems Review of Systems  10 systems reviewed and found to be negative, except as noted in the HPI.   Physical Exam Updated Vital Signs BP 148/100 (BP Location: Right Arm)   Pulse 69   Temp 97.9 F (36.6 C) (Oral)   Resp 16   Ht 5\' 10"  (1.778 m)   Wt 97.5 kg   SpO2 100%   BMI 30.85 kg/m   Physical Exam  Constitutional: He is oriented to person, place, and time. He appears well-developed and well-nourished. No distress.  HENT:  Head: Normocephalic and atraumatic.  Mouth/Throat: Oropharynx is clear and moist.  Eyes: Conjunctivae and EOM are normal. Pupils are equal, round, and reactive to light.  Neck: Normal range of motion.  Cardiovascular: Normal rate, regular rhythm and intact distal pulses.   Pulmonary/Chest: Effort normal and breath sounds normal.  Abdominal: Soft. There is no tenderness.  Musculoskeletal: Normal range of motion. He exhibits no edema, tenderness or deformity.  Left forearm with no swelling, remote well-healed traumatic scarring to the volar aspect of the forearm on the radial side. Radial pulse 2+, full range of motion to fingers, grip strength 5 out  of 5 bilaterally. No focal tenderness to palpation along the arm, no superficial cords. Full range of motion to elbow, no warmth or erythema.  Neurological: He is alert and oriented to person, place, and time.  Skin: He is not diaphoretic.  Psychiatric: He has a normal mood and affect.  Nursing note and vitals reviewed.    ED Treatments / Results  Labs (all labs ordered are listed, but only abnormal results are displayed) Labs Reviewed - No data to display  EKG  EKG Interpretation None       Radiology No results found.  Procedures Procedures (including critical care time)  Medications  Ordered in ED Medications - No data to display   Initial Impression / Assessment and Plan / ED Course  I have reviewed the triage vital signs and the nursing notes.  Pertinent labs & imaging results that were available during my care of the patient were reviewed by me and considered in my medical decision making (see chart for details).  Clinical Course    Vitals:   08/15/16 1726 08/15/16 1727  BP:  148/100  Pulse:  69  Resp:  16  Temp:  97.9 F (36.6 C)  TempSrc:  Oral  SpO2:  100%  Weight: 97.5 kg   Height: 5\' 10"  (1.778 m)     Medications  ibuprofen (ADVIL,MOTRIN) tablet 600 mg (600 mg Oral Given 08/15/16 1829)  acetaminophen (TYLENOL) tablet 650 mg (650 mg Oral Given 08/15/16 1829)    Phillip KusterJames C Anderson is 35 y.o. male presenting with Significant pain to left forearm, he had a laceration to the forearm several weeks ago, is thought that he may have had a arterial injury was evaluated by hand surgery, there was an exploratory surgery which did not reveal any significant abnormalities. He had been healing well but states that the pain increased several days ago. Left arm neurovascularly intact with no focal tenderness or swelling. No signs of infection or warmth. Will obtain ultrasound to rule out blood clot.  DVT study is negative, patient advised ibuprofen for pain control and close follow-up with Dr. Izora Ribasoley.  Evaluation does not show pathology that would require ongoing emergent intervention or inpatient treatment. Pt is hemodynamically stable and mentating appropriately. Discussed findings and plan with patient/guardian, who agrees with care plan. All questions answered. Return precautions discussed and outpatient follow up given.      Final Clinical Impressions(s) / ED Diagnoses   Final diagnoses:  None    New Prescriptions New Prescriptions   No medications on file     Wynetta Emeryicole Phillip Mathwig, PA-C 08/15/16 1916    Rolland PorterMark Maven, MD 08/27/16 2113

## 2016-12-10 ENCOUNTER — Encounter (HOSPITAL_BASED_OUTPATIENT_CLINIC_OR_DEPARTMENT_OTHER): Payer: Self-pay | Admitting: *Deleted

## 2016-12-10 ENCOUNTER — Emergency Department (HOSPITAL_BASED_OUTPATIENT_CLINIC_OR_DEPARTMENT_OTHER)
Admission: EM | Admit: 2016-12-10 | Discharge: 2016-12-10 | Disposition: A | Discharge status: Home / Self Care | Payer: Medicaid Other | Attending: Emergency Medicine | Admitting: Emergency Medicine

## 2016-12-10 DIAGNOSIS — Z5181 Encounter for therapeutic drug level monitoring: Secondary | ICD-10-CM | POA: Insufficient documentation

## 2016-12-10 DIAGNOSIS — K209 Esophagitis, unspecified without bleeding: Secondary | ICD-10-CM

## 2016-12-10 DIAGNOSIS — F17210 Nicotine dependence, cigarettes, uncomplicated: Secondary | ICD-10-CM | POA: Insufficient documentation

## 2016-12-10 DIAGNOSIS — K2960 Other gastritis without bleeding: Secondary | ICD-10-CM

## 2016-12-10 LAB — COMPREHENSIVE METABOLIC PANEL
ALT: 24 U/L (ref 17–63)
AST: 23 U/L (ref 15–41)
Albumin: 4.2 g/dL (ref 3.5–5.0)
Alkaline Phosphatase: 52 U/L (ref 38–126)
Anion gap: 6 (ref 5–15)
BUN: 7 mg/dL (ref 6–20)
CO2: 29 mmol/L (ref 22–32)
Calcium: 8.9 mg/dL (ref 8.9–10.3)
Chloride: 104 mmol/L (ref 101–111)
Creatinine, Ser: 1.03 mg/dL (ref 0.61–1.24)
GFR calc Af Amer: >60 mL/min (ref >60)
GFR calc non Af Amer: >60 mL/min (ref >60)
Glucose, Bld: 108 mg/dL — ABNORMAL HIGH (ref 65–99)
Potassium: 4 mmol/L (ref 3.5–5.1)
Sodium: 139 mmol/L (ref 135–145)
Total Bilirubin: 0.9 mg/dL (ref 0.3–1.2)
Total Protein: 7.3 g/dL (ref 6.5–8.1)

## 2016-12-10 LAB — CBC
HCT: 43.6 % (ref 39.0–52.0)
Hemoglobin: 14.6 g/dL (ref 13.0–17.0)
MCH: 31.1 pg (ref 26.0–34.0)
MCHC: 33.5 g/dL (ref 30.0–36.0)
MCV: 92.8 fL (ref 78.0–100.0)
Platelets: 186 10*3/uL (ref 150–400)
RBC: 4.7 MIL/uL (ref 4.22–5.81)
RDW: 12.8 % (ref 11.5–15.5)
WBC: 6.9 10*3/uL (ref 4.0–10.5)

## 2016-12-10 LAB — RAPID URINE DRUG SCREEN, HOSP PERFORMED
Amphetamines: NOT DETECTED
Barbiturates: NOT DETECTED
Benzodiazepines: NOT DETECTED
Cocaine: POSITIVE — AB
Opiates: NOT DETECTED
Tetrahydrocannabinol: POSITIVE — AB

## 2016-12-10 LAB — LIPASE, BLOOD: Lipase: 18 U/L (ref 11–51)

## 2016-12-10 MED ORDER — FAMOTIDINE IN NACL 20-0.9 MG/50ML-% IV SOLN
20.0000 mg | Freq: Once | INTRAVENOUS | Status: AC
  Administered 2016-12-10: 20 mg via INTRAVENOUS
  Filled 2016-12-10: qty 50

## 2016-12-10 MED ORDER — GI COCKTAIL ~~LOC~~
30.0000 mL | Freq: Once | ORAL | Status: AC
  Administered 2016-12-10: 30 mL via ORAL
  Filled 2016-12-10: qty 30

## 2016-12-10 MED ORDER — PROCHLORPERAZINE EDISYLATE 5 MG/ML IJ SOLN
10.0000 mg | Freq: Once | INTRAMUSCULAR | Status: AC
  Administered 2016-12-10: 10 mg via INTRAVENOUS
  Filled 2016-12-10: qty 2

## 2016-12-10 MED ORDER — SODIUM CHLORIDE 0.9 % IV BOLUS (SEPSIS)
1000.0000 mL | Freq: Once | INTRAVENOUS | Status: AC
  Administered 2016-12-10: 1000 mL via INTRAVENOUS

## 2016-12-10 MED ORDER — HYDROCODONE-ACETAMINOPHEN 5-325 MG PO TABS: 1.0000 | ORAL_TABLET | Freq: Four times a day (QID) | ORAL | 0 refills | Status: DC | PRN

## 2016-12-10 MED ORDER — PANTOPRAZOLE SODIUM 20 MG PO TBEC: 20.0000 mg | DELAYED_RELEASE_TABLET | Freq: Every day | ORAL | 0 refills | Status: DC

## 2016-12-10 MED ORDER — HYDROMORPHONE HCL 1 MG/ML IJ SOLN
1.0000 mg | Freq: Once | INTRAMUSCULAR | Status: AC
  Administered 2016-12-10: 1 mg via INTRAVENOUS
  Filled 2016-12-10: qty 1

## 2016-12-10 MED ORDER — DIPHENHYDRAMINE HCL 50 MG/ML IJ SOLN
25.0000 mg | Freq: Once | INTRAMUSCULAR | Status: AC
  Administered 2016-12-10: 25 mg via INTRAVENOUS
  Filled 2016-12-10: qty 1

## 2016-12-10 MED ORDER — ONDANSETRON 4 MG PO TBDP
ORAL_TABLET | ORAL | Status: AC
  Administered 2016-12-10: 4 mg
  Filled 2016-12-10: qty 1

## 2016-12-10 MED ORDER — SUCRALFATE 1 GM/10ML PO SUSP: 1.0000 g | Freq: Three times a day (TID) | ORAL | 0 refills | Status: DC

## 2016-12-10 MED ORDER — FENTANYL CITRATE (PF) 100 MCG/2ML IJ SOLN
50.0000 ug | Freq: Once | INTRAMUSCULAR | Status: AC
  Administered 2016-12-10: 50 ug via NASAL
  Filled 2016-12-10: qty 2

## 2016-12-10 MED ORDER — DICYCLOMINE HCL 20 MG PO TABS: 20.0000 mg | ORAL_TABLET | Freq: Two times a day (BID) | ORAL | 0 refills | Status: DC

## 2016-12-10 NOTE — Discharge Instructions (Signed)
HOME CARE INSTRUCTIONS Only take over-the-counter or prescription medicines as directed by your caregiver. If you were given antibiotic medicines, take them as directed. Finish them even if you start to feel better. Drink enough fluids to keep your urine clear or pale yellow. Avoid foods and drinks that make your symptoms worse, such as: Caffeine or alcoholic drinks. Chocolate. Peppermint or mint flavorings. Garlic and onions. Spicy foods. Citrus fruits, such as oranges, lemons, or limes. Tomato-based foods such as sauce, chili, salsa, and pizza. Fried and fatty foods. Eat small, frequent meals instead of large meals. SEEK IMMEDIATE MEDICAL CARE IF:  You have black or dark red stools. You vomit blood or material that looks like coffee grounds. You are unable to keep fluids down. Your abdominal pain gets worse. You have a fever. You do not feel better after 1 week. You have any other questions or concerns. MAKE SURE YOU: Understand these instructions. Will watch your condition. Will get help right away if you are not doing well or get worse.

## 2016-12-10 NOTE — ED Provider Notes (Signed)
[facility] Provider Note   CSN: [id number] Arrival date & time: 12/10/16  1412  By signing my name below, I, Linna DarnerRussell Turner, attest that this documentation has been prepared under the direction and in the presence of Arthor CaptainAbigail Daneisha Surges, PA-C. Electronically Signed: Linna Darnerussell Turner, Scribe. 12/10/2016. 4:24 PM.  History   Chief Complaint Chief Complaint  Patient presents with  . Chest Pain    The history is provided by the patient. No language interpreter was used.     HPI Comments: Phillip KusterJames C Anderson is a 36 y.o. male who presents to the Emergency Department complaining of constant chest pain for 4 days. He states his pain is worse after eating or drinking and notes that it feels like food/fluids are getting "stuck" in his throat. Pt reports he has been vomiting post-prandially since onset of his chest pain and feels like he has had acid reflux too. He endorses some shortness of breath post-prandially as well that resolves after regurgitation. He notes he had URI symptoms, including cough and congestion, 1-2 weeks ago that resolved prior to onset of his chest pain. Pt states he has taken "a lot" of ibuprofen since onset of his chest pain. No h/o the same in the past. He denies nausea or any other associated symptoms.  Past Medical History:  Diagnosis Date  . No pertinent past medical history   . Polysubstance dependence including opioid type drug, episodic abuse [facility](HCC)     Patient Active Problem List   Diagnosis Date Noted  . Polysubstance dependence including opioid type drug, episodic abuse (HCC) 03/25/2012  . TENDINITIS, SHOULDER, LEFT 09/14/2008  . TOBACCO ABUSE 12/19/2007  . CHEST WALL PAIN, ACUTE 12/11/2007  . BACK PAIN 09/30/2007    Past Surgical History:  Procedure Laterality Date  . ARTERY AND TENDON REPAIR Left 07/21/2016   Procedure: LEFT ARM EXPLORATION;  Surgeon: Knute NeuHarrill Coley, MD;  Location: [facility] OR;  Service: Orthopedics;  Laterality: Left;       Home  Medications    Prior to Admission medications   Not on File    Family History No family history on file.  Social History Social History  Substance Use Topics  . Smoking status: Current Every Day Smoker    Packs/day: 1.00    Types: Cigarettes  . Smokeless tobacco: Never Used  . Alcohol use 1.2 oz/week    2 Cans of beer per week     Allergies   Bee venom   Review of Systems Review of Systems  A complete 10 system review of systems was obtained and all systems are negative except as noted in the HPI and PMH.   Physical Exam Updated Vital Signs BP 140/97 (BP Location: Left Arm)   Pulse 73   Temp 98.6 F (37 C) (Oral)   Resp 16   Ht 5\' 10"  (1.778 m)   Wt 230 lb (104.3 kg)   SpO2 100%   BMI 33.00 kg/m   Physical Exam  Constitutional: He is oriented to person, place, and time. He appears well-developed and well-nourished. No distress.  HENT:  Head: Normocephalic and atraumatic.  Eyes: Conjunctivae and EOM are normal.  Neck: Neck supple. No tracheal deviation present.  Cardiovascular: Normal rate.   Pulmonary/Chest: Effort normal. No respiratory distress.  Musculoskeletal: Normal range of motion.  Neurological: He is alert and oriented to person, place, and time.  Skin: Skin is warm and dry.  Psychiatric: He has a normal mood and affect. His behavior is normal.  Nursing note  and vitals reviewed.    ED Treatments / Results  Labs (all labs ordered are listed, but only abnormal results are displayed) Labs Reviewed  RAPID URINE DRUG SCREEN, HOSP PERFORMED    EKG  EKG Interpretation None       Radiology No results found.  Procedures Procedures (including critical care time)  DIAGNOSTIC STUDIES: Oxygen Saturation is 100% on RA, normal by my interpretation.    COORDINATION OF CARE: 4:33 PM Discussed treatment plan with pt at bedside and pt agreed to plan.  Medications Ordered in ED Medications - No data to display   Initial Impression /  Assessment and Plan / ED Course  I have reviewed the triage vital signs and the nursing notes.  Pertinent labs & imaging results that were available during my care of the patient were reviewed by me and considered in my medical decision making (see chart for details).     Patient labs reassuring, UDS pos for thc/ cocaine. I suspect gastritis/esophagitis secondary to NSAID use. patietn also drinks a lot of ETOH. He is tolerating liquids and I have suggested a liquid diet  Willd/c home with supportive care. Appears safe for d/c- discussed return precaution.  Final Clinical Impressions(s) / ED Diagnoses   Final diagnoses:  Esophagitis  Other gastritis without hemorrhage, unspecified chronicity    New Prescriptions New Prescriptions   No medications on file   I personally performed the services described in this documentation, which was scribed in my presence. The recorded information has been reviewed and is accurate.       Arthor Captain, PA-C 12/11/16 1712    Lyndal Pulley, MD 12/12/16 [phone]

## 2016-12-10 NOTE — ED Notes (Signed)
Given fluids for fluid challenge

## 2016-12-10 NOTE — ED Notes (Signed)
PA informed of n/v and epigastric pain, unrelieved by meds given thus far

## 2016-12-10 NOTE — ED Triage Notes (Signed)
States for the past 4 days he has had epigastric pain, SOB and he is unable to drink or eat food without it getting stuck in his esophagus.

## 2019-11-28 ENCOUNTER — Encounter (HOSPITAL_COMMUNITY): Payer: Self-pay | Admitting: *Deleted

## 2019-11-28 ENCOUNTER — Other Ambulatory Visit: Payer: Self-pay

## 2019-11-28 ENCOUNTER — Emergency Department (HOSPITAL_COMMUNITY)
Admission: EM | Admit: 2019-11-28 | Discharge: 2019-11-29 | Disposition: A | Discharge status: Home / Self Care | Payer: BC Managed Care – PPO | Attending: Emergency Medicine | Admitting: Emergency Medicine

## 2019-11-28 DIAGNOSIS — F1490 Cocaine use, unspecified, uncomplicated: Secondary | ICD-10-CM | POA: Insufficient documentation

## 2019-11-28 DIAGNOSIS — F419 Anxiety disorder, unspecified: Secondary | ICD-10-CM | POA: Diagnosis not present

## 2019-11-28 DIAGNOSIS — F17210 Nicotine dependence, cigarettes, uncomplicated: Secondary | ICD-10-CM | POA: Diagnosis not present

## 2019-11-28 DIAGNOSIS — R072 Precordial pain: Secondary | ICD-10-CM | POA: Diagnosis present

## 2019-11-28 DIAGNOSIS — F1910 Other psychoactive substance abuse, uncomplicated: Secondary | ICD-10-CM

## 2019-11-28 MED ORDER — ASPIRIN 81 MG PO CHEW
324.0000 mg | CHEWABLE_TABLET | Freq: Once | ORAL | Status: AC
  Administered 2019-11-29: 324 mg via ORAL
  Filled 2019-11-28: qty 4

## 2019-11-28 MED ORDER — SODIUM CHLORIDE 0.9% FLUSH: 3.0000 mL | Freq: Once | INTRAVENOUS | Status: DC

## 2019-11-28 NOTE — ED Triage Notes (Signed)
The pt is c/o central chest pain for one hour after snorting cocaine  Sob hyperventilating

## 2019-11-29 ENCOUNTER — Emergency Department (HOSPITAL_COMMUNITY): Payer: BC Managed Care – PPO

## 2019-11-29 ENCOUNTER — Other Ambulatory Visit: Payer: Self-pay

## 2019-11-29 ENCOUNTER — Encounter (HOSPITAL_COMMUNITY): Payer: Self-pay | Admitting: Emergency Medicine

## 2019-11-29 LAB — BASIC METABOLIC PANEL
Anion gap: 14 (ref 5–15)
BUN: 11 mg/dL (ref 6–20)
CO2: 22 mmol/L (ref 22–32)
Calcium: 10 mg/dL (ref 8.9–10.3)
Chloride: 100 mmol/L (ref 98–111)
Creatinine, Ser: 1.41 mg/dL — ABNORMAL HIGH (ref 0.61–1.24)
GFR calc Af Amer: >60 mL/min (ref >60)
GFR calc non Af Amer: >60 mL/min (ref >60)
Glucose, Bld: 129 mg/dL — ABNORMAL HIGH (ref 70–99)
Potassium: 3.9 mmol/L (ref 3.5–5.1)
Sodium: 136 mmol/L (ref 135–145)

## 2019-11-29 LAB — CBC
HCT: 50.6 % (ref 39.0–52.0)
Hemoglobin: 17.4 g/dL — ABNORMAL HIGH (ref 13.0–17.0)
MCH: 30.5 pg (ref 26.0–34.0)
MCHC: 34.4 g/dL (ref 30.0–36.0)
MCV: 88.8 fL (ref 80.0–100.0)
Platelets: 248 10*3/uL (ref 150–400)
RBC: 5.7 MIL/uL (ref 4.22–5.81)
RDW: 12.2 % (ref 11.5–15.5)
WBC: 12 10*3/uL — ABNORMAL HIGH (ref 4.0–10.5)
nRBC: 0 % (ref 0.0–0.2)

## 2019-11-29 LAB — TROPONIN I (HIGH SENSITIVITY)
Troponin I (High Sensitivity): 2 ng/L (ref <18)
Troponin I (High Sensitivity): 3 ng/L (ref <18)

## 2019-11-29 MED ORDER — LORAZEPAM 2 MG/ML IJ SOLN
0.5000 mg | Freq: Once | INTRAMUSCULAR | Status: AC
  Administered 2019-11-29: 0.5 mg via INTRAVENOUS
  Filled 2019-11-29: qty 1

## 2019-11-29 MED ORDER — LORAZEPAM 2 MG/ML IJ SOLN
1.0000 mg | Freq: Once | INTRAMUSCULAR | Status: AC
  Administered 2019-11-29: 1 mg via INTRAVENOUS
  Filled 2019-11-29: qty 1

## 2019-11-29 MED ORDER — ACETAMINOPHEN 500 MG PO TABS
1000.0000 mg | ORAL_TABLET | Freq: Once | ORAL | Status: AC
Start: 2019-11-29 — End: 2019-11-29
  Administered 2019-11-29: 1000 mg via ORAL
  Filled 2019-11-29: qty 2

## 2019-11-29 NOTE — ED Notes (Signed)
Patient verbalizes understanding of discharge instructions. Opportunity for questioning and answers were provided. Armband removed by staff, pt discharged from ED.  

## 2019-11-29 NOTE — ED Notes (Signed)
[phone] pts wife Franciso Dierks wants to know pts status

## 2019-11-29 NOTE — ED Provider Notes (Signed)
[facility] Provider Note   CSN: [id number] Arrival date & time: 11/28/19  2330     History Chief Complaint  Patient presents with  . Chest Pain    Phillip [facility] is a 39 y.o. male.  The history is provided by the patient.  Chest Pain Pain location:  Substernal area Pain quality: dull   Pain radiates to:  Does not radiate Pain severity:  Moderate Onset quality:  Sudden Duration:  2 hours Timing:  Constant Progression:  Unchanged Chronicity:  New Context: at rest   Context comment:  Using a dime bag od cocaine Relieved by:  Nothing Worsened by:  Nothing Ineffective treatments:  None tried Associated symptoms: anxiety   Associated symptoms: no abdominal pain, no AICD problem, no altered mental status, no anorexia, no back pain, no claudication, no cough, no diaphoresis, no dizziness, no dysphagia, no fatigue, no fever, no headache, no heartburn, no lower extremity edema, no nausea, no near-syncope, no numbness, no orthopnea, no PND, no shortness of breath, no syncope, no vomiting and no weakness   Risk factors: no aortic disease, no high cholesterol and no prior DVT/PE        Past Medical History:  Diagnosis Date  . No pertinent past medical history   . Polysubstance dependence including opioid type drug, episodic abuse [facility])     Patient Active Problem List   Diagnosis Date Noted  . Polysubstance dependence including opioid type drug, episodic abuse ([place]) 03/25/2012  . TENDINITIS, SHOULDER, LEFT 09/14/2008  . TOBACCO ABUSE 12/19/2007  . CHEST WALL PAIN, ACUTE 12/11/2007  . BACK PAIN 09/30/2007    Past Surgical History:  Procedure Laterality Date  . ARTERY AND TENDON REPAIR Left 07/21/2016   Procedure: LEFT ARM EXPLORATION;  Surgeon: Dayna Barker, MD;  Location: [place];  Service: Orthopedics;  Laterality: Left;       History reviewed. No pertinent family history.  Social History   Tobacco Use  . Smoking status:  Current Every Day Smoker    Packs/day: 1.00    Types: Cigarettes  . Smokeless tobacco: Never Used  Substance Use Topics  . Alcohol use: Yes    Alcohol/week: 2.0 standard drinks    Types: 2 Cans of beer per week  . Drug use: Yes    Types: Cocaine    Home Medications Prior to Admission medications   Not on File    Allergies    Bee venom  Review of Systems   Review of Systems  Constitutional: Negative for diaphoresis, fatigue and fever.  HENT: Negative for trouble swallowing.   Eyes: Negative for visual disturbance.  Respiratory: Negative for cough and shortness of breath.   Cardiovascular: Positive for chest pain. Negative for orthopnea, claudication, syncope, PND and near-syncope.  Gastrointestinal: Negative for abdominal pain, anorexia, heartburn, nausea and vomiting.  Genitourinary: Negative for difficulty urinating.  Musculoskeletal: Negative for back pain.  Neurological: Negative for dizziness, weakness, numbness and headaches.  Psychiatric/Behavioral: Negative for agitation.  All other systems reviewed and are negative.   Physical Exam Updated Vital Signs BP (!) 136/97   Pulse (!) 134   Temp 98.4 F (36.9 C)   Resp 13   Ht 5\' 10"  (1.778 m)   Wt 113.4 kg   SpO2 94%   BMI 35.87 kg/m   Physical Exam Vitals and nursing note reviewed.  Constitutional:      Appearance: Normal appearance.  HENT:     Head: Normocephalic and atraumatic.  Nose: Nose normal.  Eyes:     Conjunctiva/sclera: Conjunctivae normal.     Pupils: Pupils are equal, round, and reactive to light.  Cardiovascular:     Rate and Rhythm: Normal rate and regular rhythm.     Pulses: Normal pulses.     Heart sounds: Normal heart sounds.  Pulmonary:     Effort: Pulmonary effort is normal.     Breath sounds: Normal breath sounds.  Abdominal:     General: Abdomen is flat. Bowel sounds are normal.     Tenderness: There is no abdominal tenderness. There is no guarding or rebound.   Musculoskeletal:        General: Normal range of motion.     Cervical back: Normal range of motion and neck supple.  Skin:    General: Skin is warm and dry.     Capillary Refill: Capillary refill takes less than 2 seconds.  Neurological:     General: No focal deficit present.     Mental Status: He is alert and oriented to person, place, and time.     Deep Tendon Reflexes: Reflexes normal.  Psychiatric:        Mood and Affect: Mood is anxious.     ED Results / Procedures / Treatments   Labs (all labs ordered are listed, but only abnormal results are displayed) Results for orders placed or performed during the hospital encounter of 11/28/19  Basic metabolic panel  Result Value Ref Range   Sodium 136 135 - 145 mmol/L   Potassium 3.9 3.5 - 5.1 mmol/L   Chloride 100 98 - 111 mmol/L   CO2 22 22 - 32 mmol/L   Glucose, Bld 129 (H) 70 - 99 mg/dL   BUN 11 6 - 20 mg/dL   Creatinine, Ser 8.50 (H) 0.61 - 1.24 mg/dL   Calcium 27.7 8.9 - 41.2 mg/dL   GFR calc non Af Amer >60 >60 mL/min   GFR calc Af Amer >60 >60 mL/min   Anion gap 14 5 - 15  CBC  Result Value Ref Range   WBC 12.0 (H) 4.0 - 10.5 K/uL   RBC 5.70 4.22 - 5.81 MIL/uL   Hemoglobin 17.4 (H) 13.0 - 17.0 g/dL   HCT 87.8 67.6 - 72.0 %   MCV 88.8 80.0 - 100.0 fL   MCH 30.5 26.0 - 34.0 pg   MCHC 34.4 30.0 - 36.0 g/dL   RDW 94.7 09.6 - 28.3 %   Platelets 248 150 - 400 K/uL   nRBC 0.0 0.0 - 0.2 %  Troponin I (High Sensitivity)  Result Value Ref Range   Troponin I (High Sensitivity) 3 <18 ng/L   DG Chest 2 View  Result Date: 11/29/2019 CLINICAL DATA:  Chest pain after snorting cocaine 1 hour ago. EXAM: CHEST - 2 VIEW COMPARISON:  Radiograph 01/28/2010 FINDINGS: The cardiomediastinal contours are normal. Mild central bronchial thickening. Pulmonary vasculature is normal. No consolidation, pleural effusion, or pneumothorax. No acute osseous abnormalities are seen. IMPRESSION: Mild central bronchial thickening. Electronically  Signed   By: Narda Rutherford M.D.   On: 11/29/2019 00:28    EKG EKG Interpretation  Date/Time:  Sunday November 29 2019 00:00:14 EST Ventricular Rate:  113 PR Interval:    QRS Duration: 87 QT Interval:  323 QTC Calculation: 443 R Axis:   27 Text Interpretation: Sinus tachycardia Abnormal R-wave progression, early transition Confirmed by Talena Neira ([id number]) on 11/29/2019 12:44:36 AM   Radiology DG Chest 2 View  Result Date: 11/29/2019  CLINICAL DATA:  Chest pain after snorting cocaine 1 hour ago. EXAM: CHEST - 2 VIEW COMPARISON:  Radiograph 01/28/2010 FINDINGS: The cardiomediastinal contours are normal. Mild central bronchial thickening. Pulmonary vasculature is normal. No consolidation, pleural effusion, or pneumothorax. No acute osseous abnormalities are seen. IMPRESSION: Mild central bronchial thickening. Electronically Signed   By: Narda Rutherford M.D.   On: 11/29/2019 00:28    Procedures Procedures (including critical care time)  Medications Ordered in ED Medications  sodium chloride flush (NS) 0.9 % injection 3 mL (3 mLs Intravenous Not Given 11/29/19 0003)  LORazepam (ATIVAN) injection 1 mg (has no administration in time range)  acetaminophen (TYLENOL) tablet 1,000 mg (has no administration in time range)  aspirin chewable tablet 324 mg (324 mg Oral Given 11/29/19 0004)  LORazepam (ATIVAN) injection 0.5 mg (0.5 mg Intravenous Given 11/29/19 0007)    ED Course  I have reviewed the triage vital signs and the nursing notes.  Pertinent labs & imaging results that were available during my care of the patient were reviewed by me and considered in my medical decision making (see chart for details).  Symptoms caused by cocaine and have continued secondary to anxiety.  Given ativan for cocaine induced chest pain.  States pain is better.  No SOB but still feels anxious.  told not to use any more drugs. HEART score is 1 low risk for MACE.  Ruled out for MI in the ED.    HANNAN HUTMACHER was evaluated in Emergency Department on 11/29/2019 for the symptoms described in the history of present illness. He was evaluated in the context of the global COVID-19 pandemic, which necessitated consideration that the patient might be at risk for infection with the SARS-CoV-2 virus that causes COVID-19. Institutional protocols and algorithms that pertain to the evaluation of patients at risk for COVID-19 are in a state of rapid change based on information released by regulatory bodies including the CDC and federal and state organizations. These policies and algorithms were followed during the patient's care in the ED.  Final Clinical Impression(s) / ED Diagnoses Final diagnoses:  Polysubstance abuse (HCC)    Return for weakness, numbness, changes in vision or speech, fevers >100.4 unrelieved by medication, shortness of breath, intractable vomiting, or diarrhea, abdominal pain, Inability to tolerate liquids or food, cough, altered mental status or any concerns. No signs of systemic illness or infection. The patient is nontoxic-appearing on exam and vital signs are within normal limits.   I have reviewed the triage vital signs and the nursing notes. Pertinent labs &imaging results that were available during my care of the patient were reviewed by me and considered in my medical decision making (see chart for details).  After history, exam, and medical workup I feel the patient has been appropriately medically screened and is safe for discharge home. Pertinent diagnoses were discussed with the patient. Patient was given return precautions   Ilse Billman, MD 11/29/19 0139

## 2022-02-26 ENCOUNTER — Encounter: Payer: Self-pay | Admitting: Nurse Practitioner

## 2022-02-26 ENCOUNTER — Ambulatory Visit (INDEPENDENT_AMBULATORY_CARE_PROVIDER_SITE_OTHER): Payer: 59 | Admitting: Nurse Practitioner

## 2022-02-26 VITALS — BP 154/88 | HR 64 | Temp 97.7°F | Ht 70.0 in | Wt 246.0 lb

## 2022-02-26 DIAGNOSIS — Z7689 Persons encountering health services in other specified circumstances: Secondary | ICD-10-CM

## 2022-02-26 DIAGNOSIS — I10 Essential (primary) hypertension: Secondary | ICD-10-CM

## 2022-02-26 DIAGNOSIS — R911 Solitary pulmonary nodule: Secondary | ICD-10-CM | POA: Insufficient documentation

## 2022-02-26 DIAGNOSIS — R079 Chest pain, unspecified: Secondary | ICD-10-CM

## 2022-02-26 DIAGNOSIS — R9431 Abnormal electrocardiogram [ECG] [EKG]: Secondary | ICD-10-CM | POA: Insufficient documentation

## 2022-02-26 HISTORY — DX: Essential (primary) hypertension: I10

## 2022-02-26 HISTORY — DX: Solitary pulmonary nodule: R91.1

## 2022-02-26 MED ORDER — LOSARTAN POTASSIUM 25 MG PO TABS: 25.0000 mg | ORAL_TABLET | Freq: Every day | ORAL | 1 refills | Status: DC

## 2022-02-26 NOTE — Progress Notes (Signed)
? ?New Patient Office Visit ? ?Subjective   ? ?Patient ID: Phillip Anderson, male    DOB: 01-03-1981  Age: 41 y.o. MRN: [id number] ? ?CC:  ?Chief Complaint  ?Patient presents with  ? New Patient (Initial Visit)  ? ? ?HPI ?Phillip Anderson presents to establish care ?The patient states that he is having chest pain. Started out as intermittent. Would "bring him to his knees." Now is nearly consistent at all times. Nothing seems to be helping.  ?-went to ER at [facility] Had ECG. This did show sinus bradycardia with nonspecific t wave abnormality.  ?-chest CT did show small pulmonary nodules (22mm)/ the recommendation is to repeat this in one year.  ?-labs and chest x-ray were normal.  ?-patient states that he had heart murmur as a kid. Never felt like there was a problem. Now feels like his heart is skipping beats and pain is unrelenting.  ? ?Outpatient Encounter Medications as of 02/26/2022  ?Medication Sig  ? losartan (COZAAR) 25 MG tablet Take 1 tablet (25 mg total) by mouth daily.  ? ?No facility-administered encounter medications on file as of 02/26/2022.  ? ? ?Past Medical History:  ?Diagnosis Date  ? No pertinent past medical history   ? Polysubstance dependence including opioid type drug, episodic abuse (HCC)   ? ? ?Past Surgical History:  ?Procedure Laterality Date  ? ARTERY AND TENDON REPAIR Left 07/21/2016  ? Procedure: LEFT ARM EXPLORATION;  Surgeon: Knute Neu, MD;  Location: [facility] OR;  Service: Orthopedics;  Laterality: Left;  ? ? ?History reviewed. No pertinent family history. ? ?Social History  ? ?Socioeconomic History  ? Marital status: Single  ?  Spouse name: Not on file  ? Number of children: Not on file  ? Years of education: Not on file  ? Highest education level: Not on file  ?Occupational History  ? Not on file  ?Tobacco Use  ? Smoking status: Every Day  ?  Packs/day: 1.00  ?  Types: Cigarettes  ?  Start date: 37  ? Smokeless tobacco: Never  ?Vaping Use  ? Vaping Use: Never used  ?Substance and  Sexual Activity  ? Alcohol use: Not Currently  ?  Alcohol/week: 2.0 standard drinks  ?  Types: 2 Cans of beer per week  ? Drug use: Yes  ?  Types: Cocaine  ? Sexual activity: Yes  ?  Partners: Female  ?  Birth control/protection: None  ?Other Topics Concern  ? Not on file  ?Social History Narrative  ? Not on file  ? ?Social Determinants of Health  ? ?Financial Resource Strain: Not on file  ?Food Insecurity: Not on file  ?Transportation Needs: Not on file  ?Physical Activity: Not on file  ?Stress: Not on file  ?Social Connections: Not on file  ?Intimate Partner Violence: Not on file  ? ? ?Review of Systems  ?Constitutional:  Positive for malaise/fatigue. Negative for chills and fever.  ?HENT:  Negative for congestion, sinus pain and sore throat.   ?Eyes: Negative.   ?Respiratory:  Negative for cough, shortness of breath and wheezing.   ?Cardiovascular:  Positive for chest pain. Negative for palpitations and leg swelling.  ?     High blood pressure   ?Gastrointestinal:  Negative for constipation, diarrhea, nausea and vomiting.  ?Genitourinary: Negative.   ?Musculoskeletal:  Positive for myalgias.  ?Skin: Negative.   ?Neurological:  Negative for dizziness and headaches.  ?Endo/Heme/Allergies:  Does not bruise/bleed easily.  ?Psychiatric/Behavioral:  Negative for depression.  The patient is not nervous/anxious.   ? ?  ? ? ?Objective   ? ?Today's Vitals  ? 02/26/22 1357  ?BP: (!) 154/88  ?Pulse: 64  ?Temp: 97.7 ?F (36.5 ?C)  ?SpO2: 100%  ?Weight: 246 lb (111.6 kg)  ?Height: 5\' 10"  (1.778 m)  ? ?Body mass index is 35.3 kg/m?.  ? ?Physical Exam ?Vitals and nursing note reviewed.  ?Constitutional:   ?   Appearance: Normal appearance. He is well-developed. He is toxic-appearing.  ?HENT:  ?   Head: Normocephalic and atraumatic.  ?Eyes:  ?   Pupils: Pupils are equal, round, and reactive to light.  ?Cardiovascular:  ?   Rate and Rhythm: Normal rate and regular rhythm.  ?   Pulses: Normal pulses.  ?   Heart sounds: Normal heart  sounds.  ?   Comments: Reviewed ECG from ER visit with patient. Did show sinus bradycardia along with t wave abnormalities.  ?Pulmonary:  ?   Effort: Pulmonary effort is normal.  ?   Breath sounds: Normal breath sounds.  ?Abdominal:  ?   Palpations: Abdomen is soft.  ?Musculoskeletal:     ?   General: Normal range of motion.  ?   Cervical back: Normal range of motion and neck supple.  ?Lymphadenopathy:  ?   Cervical: No cervical adenopathy.  ?Skin: ?   General: Skin is warm and dry.  ?   Capillary Refill: Capillary refill takes less than 2 seconds.  ?Neurological:  ?   General: No focal deficit present.  ?   Mental Status: He is alert and oriented to person, place, and time.  ?Psychiatric:     ?   Mood and Affect: Mood normal.     ?   Behavior: Behavior normal.     ?   Thought Content: Thought content normal.     ?   Judgment: Judgment normal.  ? ? ? ?  ? ?Assessment & Plan:  ?1. Chest pain, unspecified type ?Reviewed labs and ECG from patient's recent visit to ER. Labs were all normal. ECG did show sinus bradycardia along with non specific t wave abnormality. Refer to cardiology for further evaluation. Recommended patient return to ER for severe and unrelenting chest pain.  ?- Ambulatory referral to Cardiology ? ?2. Abnormal ECG ?Reviewed ecg with patient. Refer to cardiology for further evaluation.  ?- Ambulatory referral to Cardiology ? ?3. Essential hypertension ?Start losartan 25mg  daily. Encouraged DASH diet. Reassess in 4 weeks.  ?- losartan (COZAAR) 25 MG tablet; Take 1 tablet (25 mg total) by mouth daily.  Dispense: 30 tablet; Refill: 1 ? ?4. Encounter to establish care ?Appointment today to establish new primary care provider   ? ?5. Pulmonary nodule ?Pulmonary nodule seen on CT scan done in ER. Will repeat in one year. ?  ?Problem List Items Addressed This Visit   ? ?  ? Cardiovascular and Mediastinum  ? Essential hypertension  ? Relevant Medications  ? losartan (COZAAR) 25 MG tablet  ?  ? Respiratory  ?  Pulmonary nodule  ?  ? Other  ? Chest pain - Primary  ? Relevant Orders  ? Ambulatory referral to Cardiology  ? Abnormal ECG  ? Relevant Orders  ? Ambulatory referral to Cardiology  ? ?Other Visit Diagnoses   ? ? Encounter to establish care      ? ?  ? ? ?Return in about 4 weeks (around 03/26/2022) for blood pressure.  ? ? , NP ? ? ?

## 2022-02-27 NOTE — Progress Notes (Signed)
?  Cardiology Office Note:   ? ?Date:  02/27/2022  ? ?ID:  Phillip Anderson, DOB 1981/09/24, MRN [id number] ? ?PCP:  Carlean Jews, NP  ? ?[facility] ?Cardiologist:  Alverda Skeans, MD ?Referring MD: Carlean Jews, NP  ? ?Patient did not appear for appointment. ? ?Chief Complaint/Reason for Referral: Chest pain ? ?ASSESSMENT:   ? ?1. Precordial pain   ?2. Hypertension, unspecified type   ? ? ?PLAN:   ? ?In order of problems listed above: ?1.  Precordial pain:   ?2.  Hypertension: ? ? ? ? ?History of Present Illness:   ? ?FOCUSED PROBLEM LIST:   ?1.  Hypertension ?2.  Substance abuse with ongoing tobacco and prior cocaine ? ?The patient is a 41 y.o. male with the indicated medical history here for recommendations regarding chest pain.  The patient was seen by his primary care provider recently.  He reports constant chest pain.  He presented to an outside hospital emergency department.  His labs including cardiac biomarkers were within normal limits.  His EKG demonstrated normal sinus rhythm.  He was given a GI cocktail which did not alleviate his pain.  It was thought that perhaps he may be suffering from esophageal spasm or coronary vasospasm due to recent cocaine use.  He was prescribed nitroglycerin.  He was discharged home despite having ongoing chest pain after his work-up was negative including a chest CTA which demonstrated no pulmonary embolisms but did show multiple 5 mm pulmonary nodules. ? ?  ? ?Current Medications: ?No outpatient medications have been marked as taking for the 02/28/22 encounter (Appointment) with Orbie Pyo, MD.  ?  ? ?Allergies:    ?Bee venom and Hornet venom  ? ?Social History:   ?Social History  ? ?Tobacco Use  ? Smoking status: Every Day  ?  Packs/day: 1.00  ?  Types: Cigarettes  ?  Start date: 19  ? Smokeless tobacco: Never  ?Vaping Use  ? Vaping Use: Never used  ?Substance Use Topics  ? Alcohol use: Not Currently  ?  Alcohol/week: 2.0 standard drinks  ?  Types:  2 Cans of beer per week  ? Drug use: Yes  ?  Types: Cocaine  ?  ? ?Family Hx: ?No family history on file.  ? ?Review of Systems:   ?Please see the history of present illness.    ?All other systems reviewed and are negative. ?  ? ? ?EKGs/Labs/Other Test Reviewed:   ? ?EKG:  EKG performed November 29, 2019 that I personally reviewed demonstrates sinus tachycardia ?Prior CV studies: ? ?None available ? ?Other studies Reviewed: ?Review of the additional studies/records demonstrates: CT 03/12/14 without aortic atherosclerosis ? ? ? ? ?Signed, ?Orbie Pyo, MD  ?02/27/2022 1:22 PM    ?[facility] ?[address] [place], [place], [place]  [address] ?Phone: [phone]; Fax: [phone]  ? ?Note:  This document was prepared using Dragon voice recognition software and may include unintentional dictation errors. ?

## 2022-02-28 ENCOUNTER — Ambulatory Visit (INDEPENDENT_AMBULATORY_CARE_PROVIDER_SITE_OTHER): Payer: 59 | Admitting: Internal Medicine

## 2022-02-28 DIAGNOSIS — I10 Essential (primary) hypertension: Secondary | ICD-10-CM

## 2022-02-28 DIAGNOSIS — R072 Precordial pain: Secondary | ICD-10-CM

## 2022-03-02 ENCOUNTER — Encounter: Payer: Self-pay | Admitting: Internal Medicine

## 2022-03-27 ENCOUNTER — Ambulatory Visit: Payer: 59 | Admitting: Nurse Practitioner

## 2022-03-27 NOTE — Progress Notes (Deleted)
Established patient visit   Patient: Phillip Anderson   DOB: 1981/04/01   41 y.o. Male  MRN: [id number] Visit Date: 03/27/2022   No chief complaint on file.  Subjective    HPI  Follow up blood pressure and chest pain  -started on losartan 25mg  daily -has seen cardiology - prior testing was reviewed. No medication changes made. ?if he will need further follow up with cardiology    Medications: Outpatient Medications Prior to Visit  Medication Sig   losartan (COZAAR) 25 MG tablet Take 1 tablet (25 mg total) by mouth daily.   No facility-administered medications prior to visit.    Review of Systems  {Labs (Optional):23779}   Objective    There were no vitals taken for this visit. BP Readings from Last 3 Encounters:  02/26/22 (!) 154/88  11/29/19 114/87  12/10/16 124/89    Wt Readings from Last 3 Encounters:  02/26/22 246 lb (111.6 kg)  11/28/19 250 lb (113.4 kg)  12/10/16 230 lb (104.3 kg)    Physical Exam  ***  No results found for any visits on 03/27/22.  Assessment & Plan     Problem List Items Addressed This Visit   None    No follow-ups on file.         03/29/22, NP  [facility] at [facility] [phone] (phone) [phone] (fax)  [facility]

## 2022-05-09 ENCOUNTER — Emergency Department (HOSPITAL_COMMUNITY)
Admission: EM | Admit: 2022-05-09 | Discharge: 2022-05-09 | Disposition: A | Discharge status: Home / Self Care | Payer: 59 | Attending: Emergency Medicine | Admitting: Emergency Medicine

## 2022-05-09 ENCOUNTER — Emergency Department (HOSPITAL_COMMUNITY): Payer: 59

## 2022-05-09 ENCOUNTER — Other Ambulatory Visit: Payer: Self-pay

## 2022-05-09 ENCOUNTER — Encounter (HOSPITAL_COMMUNITY): Payer: Self-pay

## 2022-05-09 DIAGNOSIS — S51811A Laceration without foreign body of right forearm, initial encounter: Secondary | ICD-10-CM | POA: Insufficient documentation

## 2022-05-09 DIAGNOSIS — F17200 Nicotine dependence, unspecified, uncomplicated: Secondary | ICD-10-CM | POA: Diagnosis not present

## 2022-05-09 DIAGNOSIS — W268XXA Contact with other sharp object(s), not elsewhere classified, initial encounter: Secondary | ICD-10-CM | POA: Diagnosis not present

## 2022-05-09 DIAGNOSIS — Y9389 Activity, other specified: Secondary | ICD-10-CM | POA: Insufficient documentation

## 2022-05-09 DIAGNOSIS — Z23 Encounter for immunization: Secondary | ICD-10-CM | POA: Insufficient documentation

## 2022-05-09 DIAGNOSIS — S59901A Unspecified injury of right elbow, initial encounter: Secondary | ICD-10-CM | POA: Diagnosis present

## 2022-05-09 DIAGNOSIS — I10 Essential (primary) hypertension: Secondary | ICD-10-CM | POA: Diagnosis not present

## 2022-05-09 LAB — CBC WITH DIFFERENTIAL/PLATELET
Abs Immature Granulocytes: 0.01 10*3/uL (ref 0.00–0.07)
Basophils Absolute: 0 10*3/uL (ref 0.0–0.1)
Basophils Relative: 0 %
Eosinophils Absolute: 0 10*3/uL (ref 0.0–0.5)
Eosinophils Relative: 1 %
HCT: 43.2 % (ref 39.0–52.0)
Hemoglobin: 14.7 g/dL (ref 13.0–17.0)
Immature Granulocytes: 0 %
Lymphocytes Relative: 29 %
Lymphs Abs: 1.6 10*3/uL (ref 0.7–4.0)
MCH: 31.8 pg (ref 26.0–34.0)
MCHC: 34 g/dL (ref 30.0–36.0)
MCV: 93.5 fL (ref 80.0–100.0)
Monocytes Absolute: 0.4 10*3/uL (ref 0.1–1.0)
Monocytes Relative: 7 %
Neutro Abs: 3.5 10*3/uL (ref 1.7–7.7)
Neutrophils Relative %: 63 %
Platelets: 203 10*3/uL (ref 150–400)
RBC: 4.62 MIL/uL (ref 4.22–5.81)
RDW: 12.7 % (ref 11.5–15.5)
WBC: 5.5 10*3/uL (ref 4.0–10.5)
nRBC: 0 % (ref 0.0–0.2)

## 2022-05-09 LAB — COMPREHENSIVE METABOLIC PANEL
ALT: 13 U/L (ref 0–44)
AST: 16 U/L (ref 15–41)
Albumin: 3.9 g/dL (ref 3.5–5.0)
Alkaline Phosphatase: 50 U/L (ref 38–126)
Anion gap: 11 (ref 5–15)
BUN: 7 mg/dL (ref 6–20)
CO2: 24 mmol/L (ref 22–32)
Calcium: 8.6 mg/dL — ABNORMAL LOW (ref 8.9–10.3)
Chloride: 106 mmol/L (ref 98–111)
Creatinine, Ser: 1.16 mg/dL (ref 0.61–1.24)
GFR, Estimated: >60 mL/min (ref >60)
Glucose, Bld: 97 mg/dL (ref 70–99)
Potassium: 3.8 mmol/L (ref 3.5–5.1)
Sodium: 141 mmol/L (ref 135–145)
Total Bilirubin: 0.8 mg/dL (ref 0.3–1.2)
Total Protein: 6.5 g/dL (ref 6.5–8.1)

## 2022-05-09 LAB — PROTIME-INR
INR: 1 (ref 0.8–1.2)
Prothrombin Time: 13.4 seconds (ref 11.4–15.2)

## 2022-05-09 MED ORDER — CEPHALEXIN 500 MG PO CAPS: 500.0000 mg | ORAL_CAPSULE | Freq: Two times a day (BID) | ORAL | 0 refills | Status: AC

## 2022-05-09 MED ORDER — OXYCODONE-ACETAMINOPHEN 5-325 MG PO TABS
2.0000 | ORAL_TABLET | Freq: Once | ORAL | Status: AC
  Administered 2022-05-09: 2 via ORAL
  Filled 2022-05-09: qty 2

## 2022-05-09 MED ORDER — SODIUM CHLORIDE 0.9 % IV BOLUS
500.0000 mL | Freq: Once | INTRAVENOUS | Status: AC
  Administered 2022-05-09: 500 mL via INTRAVENOUS

## 2022-05-09 MED ORDER — LIDOCAINE-EPINEPHRINE (PF) 2 %-1:200000 IJ SOLN
20.0000 mL | Freq: Once | INTRAMUSCULAR | Status: DC
  Filled 2022-05-09: qty 20

## 2022-05-09 MED ORDER — MORPHINE SULFATE (PF) 4 MG/ML IV SOLN
4.0000 mg | Freq: Once | INTRAVENOUS | Status: AC
  Administered 2022-05-09: 4 mg via INTRAVENOUS
  Filled 2022-05-09: qty 1

## 2022-05-09 MED ORDER — OXYCODONE-ACETAMINOPHEN 5-325 MG PO TABS: 1.0000 | ORAL_TABLET | Freq: Four times a day (QID) | ORAL | 0 refills | Status: DC | PRN

## 2022-05-09 MED ORDER — TETANUS-DIPHTH-ACELL PERTUSSIS 5-2.5-18.5 LF-MCG/0.5 IM SUSY
0.5000 mL | PREFILLED_SYRINGE | Freq: Once | INTRAMUSCULAR | Status: AC
  Administered 2022-05-09: 0.5 mL via INTRAMUSCULAR
  Filled 2022-05-09: qty 0.5

## 2022-05-09 MED ORDER — SENNOSIDES-DOCUSATE SODIUM 8.6-50 MG PO TABS: 1.0000 | ORAL_TABLET | Freq: Every evening | ORAL | 0 refills | Status: DC | PRN

## 2022-05-09 MED ORDER — IBUPROFEN 800 MG PO TABS: 800.0000 mg | ORAL_TABLET | Freq: Three times a day (TID) | ORAL | 0 refills | Status: AC

## 2022-05-09 MED ORDER — FENTANYL CITRATE PF 50 MCG/ML IJ SOSY
100.0000 ug | PREFILLED_SYRINGE | Freq: Once | INTRAMUSCULAR | Status: DC
  Filled 2022-05-09: qty 2

## 2022-05-09 MED ORDER — CEFAZOLIN SODIUM-DEXTROSE 2-4 GM/100ML-% IV SOLN
2.0000 g | Freq: Once | INTRAVENOUS | Status: AC
  Administered 2022-05-09: 2 g via INTRAVENOUS
  Filled 2022-05-09: qty 100

## 2022-05-09 MED ORDER — ONDANSETRON HCL 4 MG/2ML IJ SOLN
4.0000 mg | Freq: Once | INTRAMUSCULAR | Status: AC
  Administered 2022-05-09: 4 mg via INTRAVENOUS
  Filled 2022-05-09: qty 2

## 2022-05-09 NOTE — ED Triage Notes (Signed)
Pt was p\ulled down by dog and scraped right elbow on metal. Ems states blood was pulsing so they placed a tourniquet at 0130. Pt arrived with tourniquet in place. Has right radial pulse and sensation in right arm. Tourniquet taken down at 0232 and pressure dressing applied.

## 2022-05-09 NOTE — Discharge Instructions (Addendum)
You were seen in the emergency room today with a laceration to the right forearm.  We were able to suture this closed but you need close follow-up with an orthopedic surgeon.  Please call the phone number listed for Dr. Yehuda Budd to schedule an appointment later today or tomorrow.  I am starting you on antibiotic to prevent infection and have called in some pain medication.  You cannot drive a car or take other strong pain medicines or drink alcohol while taking Percocet.  This may cause constipation and so I called in some constipation medicines as well.   If you develop sudden worsening pain in your arm or numbness you should return to the emergency department immediately.

## 2022-05-09 NOTE — ED Provider Notes (Signed)
Emergency Department Provider Note   I have reviewed the triage vital signs and the nursing notes.   HISTORY  Chief Complaint Laceration   HPI Phillip Anderson is a 41 y.o. male with past history reviewed below presents to the emergency department with laceration to the right elbow.  Patient was taking his dog out use the bathroom when he suddenly pulled slicing his right elbow on something metal in the doorway.  He had immediate heavy bleeding, described as pulsatile on scene with EMS.  They applied a right upper extremity tourniquet with some continued oozing blood but cessation of pulsatile bleeding.  He does not feel numbness in the hand but severe pain with moving the elbow or fingers.  No other areas of laceration.  Past Medical History:  Diagnosis Date   Allergic    BACK PAIN 09/30/2007   Qualifier: Diagnosis of  By: Tawanna Cooler MD, Tinnie Gens A    Chest pain    CHEST WALL PAIN, ACUTE 12/11/2007   Qualifier: Diagnosis of  By: Tawanna Cooler MD, Eugenio Hoes    Depression    Esophagitis    Essential hypertension 02/26/2022   Folliculitis    Lumbar strain    No pertinent past medical history    Pain in anterior left upper extremity    Polysubstance dependence including opioid type drug, episodic abuse (HCC)    Pulmonary nodule 02/26/2022   TENDINITIS, SHOULDER, LEFT 09/14/2008   Qualifier: Diagnosis of  By: Tawanna Cooler MD, Eugenio Hoes    Testicular pain    TOBACCO ABUSE 12/19/2007   Qualifier: Diagnosis of  By: Tawanna Cooler MD, Eugenio Hoes     Review of Systems  Constitutional: No fever/chills Cardiovascular: Denies chest pain. Respiratory: Denies shortness of breath. Gastrointestinal: No abdominal pain.  No nausea, no vomiting.  No diarrhea.  No constipation. Genitourinary: Negative for dysuria. Musculoskeletal: Negative for back pain. Skin: Right elbow laceration.  Neurological: Negative for headaches, focal weakness or numbness.   ____________________________________________   PHYSICAL  EXAM:  VITAL SIGNS: ED Triage Vitals  Enc Vitals Group     BP --      Pulse Rate 05/09/22 0238 76     Resp --      Temp 05/09/22 0238 98.8 F (37.1 C)     Temp src --      SpO2 05/09/22 0238 100 %     Weight 05/09/22 0239 235 lb (106.6 kg)     Height 05/09/22 0239 5\' 10"  (1.778 m)   Constitutional: Alert and oriented. Well appearing and in no acute distress. Eyes: Conjunctivae are normal.  Head: Atraumatic. Nose: No congestion/rhinnorhea. Mouth/Throat: Mucous membranes are moist.   Neck: No stridor.   Cardiovascular: Normal rate, regular rhythm.  No pulsatile bleeding from the wound with takedown of the tourniquet.  Patient with 2+ radial pulse in the right upper extremity.  Respiratory: Normal respiratory effort.  No retractions. Lungs CTAB. Gastrointestinal: Soft and nontender. No distention.  Musculoskeletal: No lower extremity tenderness nor edema. No gross deformities of extremities. Neurologic:  Normal speech and language. No gross focal neurologic deficits are appreciated.  Skin:  Skin is warm and dry.  5 cm laceration to the right proximal forearm over the extensor compartment with some extruding muscle as pictured below.  Oozing blood but no pulsatile bleeding.  Large hematoma surrounding the laceration.      ____________________________________________   LABS (all labs ordered are listed, but only abnormal results are displayed)  Labs Reviewed  COMPREHENSIVE METABOLIC PANEL -  Abnormal; Notable for the following components:      Result Value   Calcium 8.6 (*)    All other components within normal limits  CBC WITH DIFFERENTIAL/PLATELET  PROTIME-INR   ____________________________________________  RADIOLOGY  DG Elbow 2 Views Right  Result Date: 05/09/2022 CLINICAL DATA:  Elbow laceration EXAM: RIGHT ELBOW - 2 VIEW COMPARISON:  None Available. FINDINGS: No acute bony abnormality. Specifically, no fracture, subluxation, or dislocation. No joint effusion. No  radiopaque foreign body. IMPRESSION: No acute bony abnormality. Electronically Signed   By: Charlett Nose M.D.   On: 05/09/2022 03:05    ____________________________________________   PROCEDURES  Procedure(s) performed:   Marland KitchenMarland KitchenLaceration Repair  Date/Time: 05/09/2022 5:12 AM  Performed by: Maia Plan, MD Authorized by: Maia Plan, MD   Consent:    Consent obtained:  Verbal   Consent given by:  Patient   Risks, benefits, and alternatives were discussed: yes     Risks discussed:  Infection, need for additional repair, nerve damage, poor wound healing, poor cosmetic result, pain, retained foreign body, tendon damage and vascular damage   Alternatives discussed:  No treatment Universal protocol:    Patient identity confirmed:  Verbally with patient Anesthesia:    Anesthesia method:  Local infiltration   Local anesthetic:  Lidocaine 2% WITH epi Laceration details:    Location:  Shoulder/arm   Shoulder/arm location:  R lower arm   Length (cm):  6 Pre-procedure details:    Preparation:  Patient was prepped and draped in usual sterile fashion and imaging obtained to evaluate for foreign bodies Exploration:    Limited defect created (wound extended): no     Hemostasis achieved with:  Direct pressure   Imaging obtained: x-ray     Imaging outcome: foreign body not noted     Wound exploration: wound explored through full range of motion and entire depth of wound visualized     Wound extent: muscle damage     Wound extent: no foreign bodies/material noted, no nerve damage noted and no tendon damage noted     Contaminated: no   Treatment:    Area cleansed with:  Povidone-iodine and saline   Amount of cleaning:  Extensive   Irrigation solution:  Sterile saline   Irrigation volume:  1000 ml   Irrigation method:  Pressure wash   Visualized foreign bodies/material removed: no   Skin repair:    Repair method:  Sutures   Suture size:  3-0   Suture material:  Nylon   Suture  technique:  Simple interrupted   Number of sutures:  10 Approximation:    Approximation:  Loose Repair type:    Repair type:  Intermediate Post-procedure details:    Dressing:  Bulky dressing   Procedure completion:  Tolerated well, no immediate complications    ____________________________________________   INITIAL IMPRESSION / ASSESSMENT AND PLAN / ED COURSE  Pertinent labs & imaging results that were available during my care of the patient were reviewed by me and considered in my medical decision making (see chart for details).   This patient is Presenting for Evaluation of arm laceration, which does require a range of treatment options, and is a complaint that involves a high risk of morbidity and mortality.  The Differential Diagnoses include arterial injury in the right elbow, laceration of the extensor muscles in the forearm, compartment syndrome, nerve injury, open fracture.  Critical Interventions-    Medications  fentaNYL (SUBLIMAZE) injection 100 mcg (100 mcg Intravenous Incomplete 05/09/22 0338)  lidocaine-EPINEPHrine (XYLOCAINE W/EPI) 2 %-1:200000 (PF) injection 20 mL (20 mLs Infiltration Incomplete 05/09/22 0338)  sodium chloride 0.9 % bolus 500 mL (0 mLs Intravenous Stopped 05/09/22 0412)  morphine (PF) 4 MG/ML injection 4 mg (4 mg Intravenous Given 05/09/22 0253)  ondansetron (ZOFRAN) injection 4 mg (4 mg Intravenous Given 05/09/22 0254)  ceFAZolin (ANCEF) IVPB 2g/100 mL premix (0 g Intravenous Stopped 05/09/22 0412)  Tdap (BOOSTRIX) injection 0.5 mL (0.5 mLs Intramuscular Given 05/09/22 0254)  oxyCODONE-acetaminophen (PERCOCET/ROXICET) 5-325 MG per tablet 2 tablet (2 tablets Oral Given 05/09/22 0533)    Reassessment after intervention: No pulsatile bleeding.    I did obtain Additional Historical Information from patient's wife and EMS.  I decided to review pertinent External Data, and in summary no similar visits in the past.   Clinical Laboratory Tests Ordered,  included no leukocytosis or anemia.  INR normal.  No acute kidney injury.  Radiologic Tests Ordered, included plain films of the right elbow. I independently interpreted the images and agree with radiology interpretation.   Cardiac Monitor Tracing which shows NSR.   Social Determinants of Health Risk patient is a smoker.   Consult complete with Hand Surgery, Dr. Yehuda Budd. Discussed case. No evidence of compartment syndrome and the hand is well perfused with bounding radial pulse. Able to confirm ulnar pulse with doppler. Does not advise angio to the extremity. Plan is for skin closure and washout at the bedside. He can see the patient in office tomorrow.   Medical Decision Making: Summary:  Patient presents emergency department for evaluation of right elbow laceration.  He arrives with tourniquet on but after slowly removing pressure on the tourniquet I appreciate only a light venous ooze.  No pulsatile blood from the wound but he does have significant hematoma and some extruding muscle through the laceration of the extensor compartment in the proximal right forearm.  Is sensation is intact but limited extensor movement of the fourth and fifth digits.  Plan for plain films of the right elbow.  Pressure dressing placed on the wound and will send for an angio of the right upper extremity.   Reevaluation with update and discussion with patient. Pain medication sent to pharmacy along with abx. Plan if for ortho follow up in the next 48 hours. He will call for an appointment.  Considered admission but no pulsatile bleeding. Coordinated with hand surgery and can ensure close outpatient follow up.   Disposition: discharge  ____________________________________________  FINAL CLINICAL IMPRESSION(S) / ED DIAGNOSES  Final diagnoses:  Laceration of right forearm, initial encounter     NEW OUTPATIENT MEDICATIONS STARTED DURING THIS VISIT:  Discharge Medication List as of 05/09/2022  5:48 AM      START taking these medications   Details  cephALEXin (KEFLEX) 500 MG capsule Take 1 capsule (500 mg total) by mouth 2 (two) times daily for 7 days., Starting Wed 05/09/2022, Until Wed 05/16/2022, Normal    ibuprofen (ADVIL) 800 MG tablet Take 1 tablet (800 mg total) by mouth 3 (three) times daily for 7 days., Starting Wed 05/09/2022, Until Wed 05/16/2022, Normal    oxyCODONE-acetaminophen (PERCOCET/ROXICET) 5-325 MG tablet Take 1 tablet by mouth every 6 (six) hours as needed for severe pain., Starting Wed 05/09/2022, Normal    senna-docusate (SENOKOT-S) 8.6-50 MG tablet Take 1 tablet by mouth at bedtime as needed for mild constipation., Starting Wed 05/09/2022, Normal        Note:  This document was prepared using Dragon voice recognition software and may include unintentional  dictation errors.  Alona Bene, MD, [facility]    Kemauri Musa, Arlyss Repress, MD 05/09/22 [id number]

## 2022-05-11 ENCOUNTER — Ambulatory Visit (INDEPENDENT_AMBULATORY_CARE_PROVIDER_SITE_OTHER): Payer: 59 | Admitting: Orthopedic Surgery

## 2022-05-11 DIAGNOSIS — S51811A Laceration without foreign body of right forearm, initial encounter: Secondary | ICD-10-CM | POA: Insufficient documentation

## 2022-05-11 NOTE — Progress Notes (Signed)
Office Visit Note   Patient: Phillip Anderson           Date of Birth: 08/04/1981           MRN: [id number] Visit Date: 05/11/2022              Requested by: Carlean Jews, NP [address] [place],  [place] [address] PCP: Carlean Jews, NP   Assessment & Plan: Visit Diagnoses:  1. Laceration of right forearm, initial encounter     Plan: Patient has a laceration to the right lateral proximal forearm that seems to be through the muscle belly of the common extensors based on the ER imaging and per the ER report.  The wound was explored with damage to the muscle belly but no exposed or injured tendon.  He is able to weakly extend at the fingers and the wrist but is limited secondary to pain at the incision site.  The wound is hemostatic.  His compartments are soft and compressible with no concern for a previous compartment syndrome.  His hand is warm and well-perfused.  I can see him back next week to see if he is doing any better in terms of pain control.  Follow-Up Instructions: No follow-ups on file.   Orders:  No orders of the defined types were placed in this encounter.  No orders of the defined types were placed in this encounter.     Procedures: No procedures performed   Clinical Data: No additional findings.   Subjective: Chief Complaint  Patient presents with   Right Elbow - Wound Check    This is a 41 year old right-hand-dominant male who presents for ER follow-up of a right elbow laceration.  Patient was walking his dog 2 days ago when the dog pulled the leash and patient sliced his right lateral proximal forearm on a metal object.  A tourniquet was applied in the field for what was described as pulsatile bleeding.  The tourniquet was removed in the ER and there was no pulsatile bleeding but mild oozing blood per report.  The wound was thoroughly irrigated with copious sterile saline.  The wound was explored per the ER documentation and there was a  laceration to the muscle belly but no tendon involvement.  Another hand surgeon was consulted and the patient was instructed to follow-up yesterday.  Patient was unable to follow-up secondary to insurance issues and presents to my office today.  He describes severe pain at the proximal lateral aspect of the forearm.  He has limited finger and wrist range of motion secondary to pain around the laceration.  The swelling seems to have improved since yesterday per patient and witness.  Wound Check    Review of Systems   Objective: Vital Signs: There were no vitals taken for this visit.  Physical Exam Constitutional:      Appearance: Normal appearance.  Cardiovascular:     Rate and Rhythm: Normal rate.     Pulses: Normal pulses.  Pulmonary:     Effort: Pulmonary effort is normal.  Skin:    General: Skin is warm and dry.     Capillary Refill: Capillary refill takes less than 2 seconds.  Neurological:     Mental Status: He is alert.     Right Elbow Exam   Tenderness  Right elbow tenderness location: TTP at lateral aspect of elbow around laceration.   Other  Erythema: absent Sensation: normal Pulse: present  Comments:  Approx 6  cm laceration at proximal and lateral aspect of elbow distal to the lateral epicondyle.  Wound is hemostatic.  Mild to moderate associated swelling.  Limited ROM of wrist and fingers secondary to proximal forearm pain but some extension is present.  SILT throughout hand.  Hand is warm and well perfused.       Specialty Comments:  No specialty comments available.  Imaging: No results found.   PMFS History: Patient Active Problem List   Diagnosis Date Noted   Laceration of right forearm 05/11/2022   Chest pain 02/26/2022   Abnormal ECG 02/26/2022   Essential hypertension 02/26/2022   Pulmonary nodule 02/26/2022   Polysubstance dependence including opioid type drug, episodic abuse (HCC) 03/25/2012   TENDINITIS, SHOULDER, LEFT 09/14/2008    TOBACCO ABUSE 12/19/2007   CHEST WALL PAIN, ACUTE 12/11/2007   BACK PAIN 09/30/2007   Past Medical History:  Diagnosis Date   Allergic    BACK PAIN 09/30/2007   Qualifier: Diagnosis of  By: Tawanna Cooler MD, Tinnie Gens A    Chest pain    CHEST WALL PAIN, ACUTE 12/11/2007   Qualifier: Diagnosis of  By: Tawanna Cooler MD, Eugenio Hoes    Depression    Esophagitis    Essential hypertension 02/26/2022   Folliculitis    Lumbar strain    No pertinent past medical history    Pain in anterior left upper extremity    Polysubstance dependence including opioid type drug, episodic abuse (HCC)    Pulmonary nodule 02/26/2022   TENDINITIS, SHOULDER, LEFT 09/14/2008   Qualifier: Diagnosis of  By: Tawanna Cooler MD, Eugenio Hoes    Testicular pain    TOBACCO ABUSE 12/19/2007   Qualifier: Diagnosis of  By: Tawanna Cooler MD, Eugenio Hoes     No family history on file.  Past Surgical History:  Procedure Laterality Date   ARTERY AND TENDON REPAIR Left 07/21/2016   Procedure: LEFT ARM EXPLORATION;  Surgeon: Knute Neu, MD;  Location: [facility] OR;  Service: Orthopedics;  Laterality: Left;   Social History   Occupational History   Not on file  Tobacco Use   Smoking status: Every Day    Packs/day: 1.00    Types: Cigarettes    Start date: 79   Smokeless tobacco: Never  Vaping Use   Vaping Use: Never used  Substance and Sexual Activity   Alcohol use: Not Currently    Alcohol/week: 2.0 standard drinks of alcohol    Types: 2 Cans of beer per week   Drug use: Yes    Types: Cocaine   Sexual activity: Yes    Partners: Female    Birth control/protection: None

## 2022-05-12 ENCOUNTER — Encounter (HOSPITAL_COMMUNITY): Payer: Self-pay | Admitting: Emergency Medicine

## 2022-05-12 ENCOUNTER — Emergency Department (HOSPITAL_COMMUNITY): Payer: 59

## 2022-05-12 ENCOUNTER — Emergency Department (HOSPITAL_COMMUNITY)
Admission: EM | Admit: 2022-05-12 | Discharge: 2022-05-12 | Disposition: A | Discharge status: Home / Self Care | Payer: 59 | Attending: Emergency Medicine | Admitting: Emergency Medicine

## 2022-05-12 ENCOUNTER — Other Ambulatory Visit: Payer: Self-pay

## 2022-05-12 DIAGNOSIS — I10 Essential (primary) hypertension: Secondary | ICD-10-CM | POA: Insufficient documentation

## 2022-05-12 DIAGNOSIS — M79631 Pain in right forearm: Secondary | ICD-10-CM | POA: Diagnosis present

## 2022-05-12 DIAGNOSIS — Z79899 Other long term (current) drug therapy: Secondary | ICD-10-CM | POA: Diagnosis not present

## 2022-05-12 LAB — BASIC METABOLIC PANEL
Anion gap: 9 (ref 5–15)
BUN: <5 mg/dL — ABNORMAL LOW (ref 6–20)
CO2: 24 mmol/L (ref 22–32)
Calcium: 8.3 mg/dL — ABNORMAL LOW (ref 8.9–10.3)
Chloride: 107 mmol/L (ref 98–111)
Creatinine, Ser: 0.91 mg/dL (ref 0.61–1.24)
GFR, Estimated: >60 mL/min (ref >60)
Glucose, Bld: 99 mg/dL (ref 70–99)
Potassium: 3 mmol/L — ABNORMAL LOW (ref 3.5–5.1)
Sodium: 140 mmol/L (ref 135–145)

## 2022-05-12 LAB — CBC WITH DIFFERENTIAL/PLATELET
Abs Immature Granulocytes: 0.02 10*3/uL (ref 0.00–0.07)
Basophils Absolute: 0 10*3/uL (ref 0.0–0.1)
Basophils Relative: 0 %
Eosinophils Absolute: 0.1 10*3/uL (ref 0.0–0.5)
Eosinophils Relative: 2 %
HCT: 35.7 % — ABNORMAL LOW (ref 39.0–52.0)
Hemoglobin: 12.1 g/dL — ABNORMAL LOW (ref 13.0–17.0)
Immature Granulocytes: 0 %
Lymphocytes Relative: 35 %
Lymphs Abs: 2.2 10*3/uL (ref 0.7–4.0)
MCH: 31.1 pg (ref 26.0–34.0)
MCHC: 33.9 g/dL (ref 30.0–36.0)
MCV: 91.8 fL (ref 80.0–100.0)
Monocytes Absolute: 0.5 10*3/uL (ref 0.1–1.0)
Monocytes Relative: 7 %
Neutro Abs: 3.6 10*3/uL (ref 1.7–7.7)
Neutrophils Relative %: 56 %
Platelets: 172 10*3/uL (ref 150–400)
RBC: 3.89 MIL/uL — ABNORMAL LOW (ref 4.22–5.81)
RDW: 13 % (ref 11.5–15.5)
WBC: 6.4 10*3/uL (ref 4.0–10.5)
nRBC: 0 % (ref 0.0–0.2)

## 2022-05-12 MED ORDER — HYDROMORPHONE HCL 1 MG/ML IJ SOLN: 2.0000 mg | Freq: Once | INTRAMUSCULAR | Status: DC

## 2022-05-12 MED ORDER — FENTANYL CITRATE PF 50 MCG/ML IJ SOSY
100.0000 ug | PREFILLED_SYRINGE | Freq: Once | INTRAMUSCULAR | Status: AC
  Administered 2022-05-12: 100 ug via INTRAVENOUS
  Filled 2022-05-12: qty 2

## 2022-05-12 MED ORDER — OXYCODONE HCL 5 MG PO TABS: 5.0000 mg | ORAL_TABLET | Freq: Four times a day (QID) | ORAL | 0 refills | Status: DC | PRN

## 2022-05-12 MED ORDER — IBUPROFEN 800 MG PO TABS: 800.0000 mg | ORAL_TABLET | Freq: Three times a day (TID) | ORAL | 0 refills | Status: DC | PRN

## 2022-05-12 MED ORDER — KETOROLAC TROMETHAMINE 30 MG/ML IJ SOLN
30.0000 mg | Freq: Once | INTRAMUSCULAR | Status: AC
  Administered 2022-05-12: 30 mg via INTRAVENOUS
  Filled 2022-05-12: qty 1

## 2022-05-12 MED ORDER — HYDROMORPHONE HCL 1 MG/ML IJ SOLN
1.0000 mg | Freq: Once | INTRAMUSCULAR | Status: AC
  Administered 2022-05-12: 1 mg via INTRAVENOUS
  Filled 2022-05-12: qty 1

## 2022-05-12 MED ORDER — HYDROMORPHONE HCL 1 MG/ML IJ SOLN
1.0000 mg | Freq: Once | INTRAMUSCULAR | Status: AC
  Administered 2022-05-12: 1 mg via INTRAMUSCULAR
  Filled 2022-05-12: qty 1

## 2022-05-12 NOTE — ED Notes (Signed)
Extreme awelling to his entire rt arm from a laceration that was sutured  2-4 nights ago   redness around the incision  blu redness extending upthe rt humerus from the tourniquet that was applied on the way here initially  good radial pulse rt  wrist

## 2022-05-12 NOTE — Progress Notes (Signed)
Patient seen in the ED for right upper extremity pain. Briefly, patient sustained transverse laceration to the proximal dorsal forearm on 05/09/22. Saw Dr. Frazier Butt yesterday in the office and is supposed to follow up with him again next week.  Currently, he describes constant throbbing pain not any better with any particular positions. Required several doses of Dilaudid in the ED. Has had a prior history of substance abuse. Based on my  examination this morning patient has some focal swelling around the traumatic site. There is no drainage or signs of infection. Compartments are soft and compressible. PIN nerve function intact with some guarding to wrist and finger extension secondary to pain. Capillary refill is normal. Mild discomfort to passive stretch. Based on my assessment, I feel that the symptoms are fairly consistent with recent injury. Reassurance was provide d that this will improve with aggressive elevation, ice, compression with ACE. Would recommend scheduled NSAIDs and to minimize use of opiates. Will send patient home with blue arm elevator to be used 10 to 12 hours a day until symptoms improve.  He has a scheduled appointment with Dr. Frazier Butt on Tuesday which he will keep.  Questions, encouraging answer. All of this was also communicated to the ED provider.

## 2022-05-12 NOTE — ED Triage Notes (Signed)
Patient reports persistent pain at right elbow injured 2 days ago /sutured here unrelieved by prescription pain medication .

## 2022-05-12 NOTE — ED Provider Notes (Signed)
[facility] EMERGENCY DEPARTMENT Provider Note   CSN: [id number] Arrival date & time: 05/12/22  0118     History  Chief Complaint  Patient presents with   Arm Pain     Phillip Anderson is a 41 y.o. male.  The history is provided by the patient.  Patient presents for reevaluation of his right arm.  Patient sustained a laceration to the right forearm on July 12.  It was cleaned and repaired in the ER.  He was seen on July 14 by the orthopedic specialist.  Since leaving his office, has had increasing pain, swelling and his fingers feel numb. No new trauma.  No fevers but he reports having vomiting due to the pain. He no longer has any home pain medications.  He continues to take his Keflex    Past Medical History:  Diagnosis Date   Allergic    BACK PAIN 09/30/2007   Qualifier: Diagnosis of  By: Tawanna Cooler MD, Tinnie Gens A    Chest pain    CHEST WALL PAIN, ACUTE 12/11/2007   Qualifier: Diagnosis of  By: Tawanna Cooler MD, Eugenio Hoes    Depression    Esophagitis    Essential hypertension 02/26/2022   Folliculitis    Lumbar strain    No pertinent past medical history    Pain in anterior left upper extremity    Polysubstance dependence including opioid type drug, episodic abuse (HCC)    Pulmonary nodule 02/26/2022   TENDINITIS, SHOULDER, LEFT 09/14/2008   Qualifier: Diagnosis of  By: Tawanna Cooler MD, Eugenio Hoes    Testicular pain    TOBACCO ABUSE 12/19/2007   Qualifier: Diagnosis of  By: Tawanna Cooler MD, Eugenio Hoes     Home Medications Prior to Admission medications   Medication Sig Start Date End Date Taking? Authorizing Provider  cephALEXin (KEFLEX) 500 MG capsule Take 1 capsule (500 mg total) by mouth 2 (two) times daily for 7 days. 05/09/22 05/16/22  Long, Arlyss Repress, MD  ibuprofen (ADVIL) 800 MG tablet Take 1 tablet (800 mg total) by mouth 3 (three) times daily for 7 days. 05/09/22 05/16/22  Long, Arlyss Repress, MD  losartan (COZAAR) 25 MG tablet Take 1 tablet (25 mg total) by mouth daily. 02/26/22    Carlean Jews, NP  oxyCODONE-acetaminophen (PERCOCET/ROXICET) 5-325 MG tablet Take 1 tablet by mouth every 6 (six) hours as needed for severe pain. 05/09/22   Long, Arlyss Repress, MD  senna-docusate (SENOKOT-S) 8.6-50 MG tablet Take 1 tablet by mouth at bedtime as needed for mild constipation. 05/09/22   Long, Arlyss Repress, MD      Allergies    Bee venom and Hornet venom    Review of Systems   Review of Systems  Constitutional:  Negative for fever.  Gastrointestinal:  Positive for vomiting.  Musculoskeletal:  Positive for arthralgias.  Neurological:  Positive for numbness.    Physical Exam Updated Vital Signs BP (!) 165/109 (BP Location: Left Arm)   Pulse 64   Temp 98.7 F (37.1 C) (Oral)   Resp 18   SpO2 100%  Physical Exam CONSTITUTIONAL: Well developed/well nourished, anxious HEAD: Normocephalic/atraumatic EYES: EOMI ENMT: Mucous membranes moist CV: S1/S2 noted, no murmurs/rubs/gallops noted LUNGS: Lungs are clear to auscultation bilaterally, no apparent distress ABDOMEN: soft, NEURO: Pt is awake/alert/appropriate, moves all extremitiesx4.  No facial droop.  Patient reports numbness to his right middle and ring finger.  He is able to move the fingers on the right hand but limited due to pain He  is able to flex and extend the right elbow but limited due to pain EXTREMITIES: See photo below.  Bounding pulses noted in bilateral upper extremities.  The arm is warm to touch.  Significant tenderness noted over the swelling on the right proximal forearm SKIN: warm, color normal PSYCH: Anxious      Patient gave verbal permission to utilize photo for medical documentation only The image was not stored on any personal device ED Results / Procedures / Treatments   Labs (all labs ordered are listed, but only abnormal results are displayed) Labs Reviewed  CBC WITH DIFFERENTIAL/PLATELET - Abnormal; Notable for the following components:      Result Value   RBC 3.89 (*)    Hemoglobin  12.1 (*)    HCT 35.7 (*)    All other components within normal limits  BASIC METABOLIC PANEL - Abnormal; Notable for the following components:   Potassium 3.0 (*)    BUN <5 (*)    Calcium 8.3 (*)    All other components within normal limits    EKG None  Radiology DG Forearm Right  Result Date: 05/12/2022 CLINICAL DATA:  Right upper extremity pain. EXAM: RIGHT FOREARM - 2 VIEW COMPARISON:  Right elbow radiograph dated 05/09/2022. FINDINGS: No acute fracture or dislocation. The bones are well mineralized. No arthritic changes. Mild soft tissue swelling of the dorsum the elbow and proximal forearm may represent cellulitis. Several punctate radiopaque densities over the skin along the dorsal elbow may represent gravel. Clinical correlation is recommended. IMPRESSION: 1. No acute fracture or dislocation. 2. Mild soft tissue swelling of the dorsum of the elbow and proximal forearm. Electronically Signed   By: Elgie Collard M.D.   On: 05/12/2022 02:12    Procedures Procedures    Medications Ordered in ED Medications  fentaNYL (SUBLIMAZE) injection 100 mcg (has no administration in time range)  HYDROmorphone (DILAUDID) injection 1 mg (1 mg Intramuscular Given 05/12/22 0211)  ketorolac (TORADOL) 30 MG/ML injection 30 mg (30 mg Intravenous Given 05/12/22 0457)  HYDROmorphone (DILAUDID) injection 1 mg (1 mg Intravenous Given 05/12/22 0503)    ED Course/ Medical Decision Making/ A&P Clinical Course as of 05/12/22 0717  Sat May 12, 2022  0213 Patient reports his pain accelerated after leaving the orthopedic office.  He reports numbness in his fingers.  X-ray labs been ordered.  We will consult orthopedics [DW]  0235 Patient had no relief after Dilaudid.  Pulses remain intact, forearm is soft but tender.  We will keep arm elevated.  Awaiting callback from orthopedic [DW]  0320 Potassium(!): 3.0 Mild hypokalemia [DW]  0320 Hemoglobin(!): 12.1 Mild drop in hemoglobin [DW]  0321 Placing another  call to orthopedics as they have not responded as of yet [DW]  0419 Discussed case with Dr. Roda Shutters  with orthopedics.  We have discussed the patient's case.  He request to give patient IV Toradol.  If no improvement, he will be seen in the emergency department [DW]  [phone] Signed out to Dr Renaye Rakers with orthopedic consult pending.  Patient's exam has remain unchanged, distal pulses intact, forearm is soft.   [DW]    Clinical Course User Index [DW] Zadie Rhine, MD                           Medical Decision Making Amount and/or Complexity of Data Reviewed Labs: ordered. Decision-making details documented in ED Course. Radiology: ordered.  Risk Prescription drug management.   This patient  presents to the ED for concern of arm pain and swelling, this involves an extensive number of treatment options, and is a complaint that carries with it a high risk of complications and morbidity.  The differential diagnosis includes but is not limited to compartment syndrome, necrotizing fasciitis, postoperative pain, wound infection  Comorbidities that complicate the patient evaluation: Patient's presentation is complicated by their history of hypertension  Social Determinants of Health: Patient's  previous history of substance use is   increases the complexity of managing their presentation  Additional history obtained: Additional history obtained from spouse Records reviewed  Ortho care notes were reviewed  Lab Tests: I Ordered, and personally interpreted labs.  The pertinent results include: Mild hypokalemia  Imaging Studies ordered: I ordered imaging studies including X-ray forearm   I independently visualized and interpreted imaging which showed no acute fracture I agree with the radiologist interpretation  Medicines ordered and prescription drug management: I ordered medication including Dilaudid, Toradol for pain Reevaluation of the patient after these medicines showed that the patient     stayed the same   Consultations Obtained: I requested consultation with the consultant Dr Roda Shutters , and discussed  findings as well as pertinent plan - they recommend: will see patient  Reevaluation: After the interventions noted above, I reevaluated the patient and found that they have :stayed the same  Complexity of problems addressed: Patient's presentation is most consistent with  acute presentation with potential threat to life or bodily function             Final Clinical Impression(s) / ED Diagnoses Final diagnoses:  Right forearm pain    Rx / DC Orders ED Discharge Orders     None         Zadie Rhine, MD 05/12/22 [phone]

## 2022-05-12 NOTE — ED Provider Notes (Signed)
41 yo male presenting with arm complaint, after fall a few days ago Complaining of pain and numbness in his fingers Several rounds of pain medications given  EDP spoke to Dr Roda Shutters from [facility] who will have teammate come evaluate patient  Physical Exam  BP 123/82   Pulse 61   Temp 98.7 F (37.1 C) (Oral)   Resp 18   SpO2 96%   Physical Exam  Procedures  Procedures  ED Course / MDM   Clinical Course as of 05/12/22 0821  Sat May 12, 2022  0213 Patient reports his pain accelerated after leaving the orthopedic office.  He reports numbness in his fingers.  X-ray labs been ordered.  We will consult orthopedics [DW]  0235 Patient had no relief after Dilaudid.  Pulses remain intact, forearm is soft but tender.  We will keep arm elevated.  Awaiting callback from orthopedic [DW]  0320 Potassium(!): 3.0 Mild hypokalemia [DW]  0320 Hemoglobin(!): 12.1 Mild drop in hemoglobin [DW]  0321 Placing another call to orthopedics as they have not responded as of yet [DW]  0419 Discussed case with Dr. Roda Shutters  with orthopedics.  We have discussed the patient's case.  He request to give patient IV Toradol.  If no improvement, he will be seen in the emergency department [DW]  [phone] Signed out to Dr Renaye Rakers with orthopedic consult pending.  Patient's exam has remain unchanged, distal pulses intact, forearm is soft.   [DW]    Clinical Course User Index [DW] Zadie Rhine, MD   Medical Decision Making Amount and/or Complexity of Data Reviewed Labs: ordered. Decision-making details documented in ED Course. Radiology: ordered.  Risk Prescription drug management.   Patient was seen and evaluated by the orthopedic surgeon Dr Roda Shutters who agrees with earlier provider that this is not likely compartment syndrome.  He wonders whether blood products may be causing irritation of the muscle or nerves in the arm.  He recommended that the patient be discharged home with an arm elevator, if this is available, and follows  up in the office next week.  Continued with NSAID treatment at home.  Patient will otherwise be discharged.       Terald Sleeper, MD 05/12/22 [phone]

## 2022-05-12 NOTE — Discharge Instructions (Addendum)
The orthopedic doctor who saw you today recommends that you keep your arm elevated above 90 degrees as much as possible while you are awake at home, 10 to 12 hours a day if possible.  We will follow-up with him in the office next week

## 2022-05-15 ENCOUNTER — Ambulatory Visit: Payer: 59 | Admitting: Orthopedic Surgery

## 2022-05-19 ENCOUNTER — Emergency Department (HOSPITAL_COMMUNITY)
Admission: EM | Admit: 2022-05-19 | Discharge: 2022-05-19 | Disposition: A | Discharge status: Home / Self Care | Payer: 59 | Attending: Emergency Medicine | Admitting: Emergency Medicine

## 2022-05-19 ENCOUNTER — Encounter (HOSPITAL_COMMUNITY): Payer: Self-pay | Admitting: Emergency Medicine

## 2022-05-19 DIAGNOSIS — Z5321 Procedure and treatment not carried out due to patient leaving prior to being seen by health care provider: Secondary | ICD-10-CM | POA: Insufficient documentation

## 2022-05-19 DIAGNOSIS — M79631 Pain in right forearm: Secondary | ICD-10-CM | POA: Insufficient documentation

## 2022-05-19 LAB — LACTIC ACID, PLASMA: Lactic Acid, Venous: 1.9 mmol/L (ref 0.5–1.9)

## 2022-05-19 LAB — CBC WITH DIFFERENTIAL/PLATELET
Abs Immature Granulocytes: 0.02 10*3/uL (ref 0.00–0.07)
Basophils Absolute: 0 10*3/uL (ref 0.0–0.1)
Basophils Relative: 0 %
Eosinophils Absolute: 0.1 10*3/uL (ref 0.0–0.5)
Eosinophils Relative: 1 %
HCT: 42 % (ref 39.0–52.0)
Hemoglobin: 14.2 g/dL (ref 13.0–17.0)
Immature Granulocytes: 0 %
Lymphocytes Relative: 22 %
Lymphs Abs: 1.3 10*3/uL (ref 0.7–4.0)
MCH: 31.6 pg (ref 26.0–34.0)
MCHC: 33.8 g/dL (ref 30.0–36.0)
MCV: 93.3 fL (ref 80.0–100.0)
Monocytes Absolute: 0.3 10*3/uL (ref 0.1–1.0)
Monocytes Relative: 6 %
Neutro Abs: 4.1 10*3/uL (ref 1.7–7.7)
Neutrophils Relative %: 71 %
Platelets: 235 10*3/uL (ref 150–400)
RBC: 4.5 MIL/uL (ref 4.22–5.81)
RDW: 13.4 % (ref 11.5–15.5)
WBC: 5.8 10*3/uL (ref 4.0–10.5)
nRBC: 0 % (ref 0.0–0.2)

## 2022-05-19 LAB — COMPREHENSIVE METABOLIC PANEL
ALT: 10 U/L (ref 0–44)
AST: 14 U/L — ABNORMAL LOW (ref 15–41)
Albumin: 3.9 g/dL (ref 3.5–5.0)
Alkaline Phosphatase: 58 U/L (ref 38–126)
Anion gap: 8 (ref 5–15)
BUN: 7 mg/dL (ref 6–20)
CO2: 26 mmol/L (ref 22–32)
Calcium: 9 mg/dL (ref 8.9–10.3)
Chloride: 108 mmol/L (ref 98–111)
Creatinine, Ser: 1.01 mg/dL (ref 0.61–1.24)
GFR, Estimated: >60 mL/min (ref >60)
Glucose, Bld: 99 mg/dL (ref 70–99)
Potassium: 3.5 mmol/L (ref 3.5–5.1)
Sodium: 142 mmol/L (ref 135–145)
Total Bilirubin: 0.7 mg/dL (ref 0.3–1.2)
Total Protein: 6.6 g/dL (ref 6.5–8.1)

## 2022-05-19 MED ORDER — ACETAMINOPHEN 325 MG PO TABS
650.0000 mg | ORAL_TABLET | Freq: Once | ORAL | Status: AC
  Administered 2022-05-19: 650 mg via ORAL
  Filled 2022-05-19: qty 2

## 2022-05-19 NOTE — ED Provider Triage Note (Addendum)
Emergency Medicine Provider Triage Evaluation Note  ISOM KOCHAN , a 41 y.o. male  was evaluated in triage.  Pt complains of pain and swelling in his right elbow.  Reports laceration to right elbow 2 weeks ago, worsening pain and swelling..  Review of Systems  Positive: Right arm pain swelling Negative: Fever  Physical Exam  BP (!) 158/104   Pulse 75   Temp 99 F (37.2 C) (Oral)   Resp 18   SpO2 100%  Gen:   Awake, no distress   Resp:  Normal effort  MSK:   Localized swelling to right lateral elbow where patient has sutures in place, mild bleeding from site, area is swollen and boggy, question hematoma Other:  Strong right radial pulse present.  Medical Decision Making  Medically screening exam initiated at 6:56 PM.  Appropriate orders placed.  Sallyanne Kuster was informed that the remainder of the evaluation will be completed by another provider, this initial triage assessment does not replace that evaluation, and the importance of remaining in the ED until their evaluation is complete.        Jeannie Fend, PA-C 05/19/22 1852    Jeannie Fend, PA-C 05/19/22 [phone]

## 2022-05-19 NOTE — ED Triage Notes (Signed)
Patient with complaint of worsening pain in right forearm, stitches placed on 7/12. Patient has not had wound evaluated outside of ED since then. Patietn is alert, afebrile, and in no apparent distress at this time.

## 2022-05-19 NOTE — ED Notes (Signed)
Pt stated he was leaving AMA, saying "if my arm's infected, call me"

## 2022-05-21 ENCOUNTER — Ambulatory Visit (INDEPENDENT_AMBULATORY_CARE_PROVIDER_SITE_OTHER): Payer: 59 | Admitting: Orthopedic Surgery

## 2022-05-21 DIAGNOSIS — S51811A Laceration without foreign body of right forearm, initial encounter: Secondary | ICD-10-CM

## 2022-05-21 MED ORDER — TRAMADOL HCL 50 MG PO TABS: 50.0000 mg | ORAL_TABLET | Freq: Four times a day (QID) | ORAL | 0 refills | Status: DC | PRN

## 2022-05-21 NOTE — H&P (View-Only) (Signed)
Office Visit Note   Patient: Phillip Anderson           Date of Birth: 27-May-1981           MRN: [id number] Visit Date: 05/21/2022              Requested by: Carlean Jews, NP [address] [place],  [place] [address] PCP: Carlean Jews, NP   Assessment & Plan: Visit Diagnoses:  1. Laceration of right forearm, initial encounter     Plan: Patient has continued pain in the right elbow and forearm.  His swelling seems improved from when I saw him 10 days ago.  He has improved ROM of the wrist and fingers with intact PIN function.  He does have a hematoma at the lateral proximal forearm with mild dehiscence of the previously closed wound.  The wound is hemostatic.  There is no purulence. We discussed formal I&D in the operating room versus wound care at home with warm, soapy water and dry dressing changes.  He and his girlfriend would like to do wound care at home and will call in a few days if appearance of wound worsens.   Follow-Up Instructions: No follow-ups on file.   Orders:  No orders of the defined types were placed in this encounter.  No orders of the defined types were placed in this encounter.     Procedures: No procedures performed   Clinical Data: No additional findings.   Subjective: Chief Complaint  Patient presents with   Right Elbow - Pain, Follow-up    This is a 41 year old right-hand-dominant male who presents forfollow-up of a right elbow laceration.  Patient was walking his dog approximately 14 days ago when the dog pulled the leash and patient sliced his right lateral proximal forearm on a metal object.  The wound was explored per the ER documentation and there was a laceration to the muscle belly but no tendon involvement.    Patient was seen in the ER the day after our previous visit for continued pain.  He also presented to the ER 2 days ago with the same issue.  His girlfriend notes that the swelling is much improved today.  He  describes continued severe pain in the entire extremity from the upper arm through the hand.  He has much improved active range of motion of the wrist and fingers today than at my initial evaluation.  He notes that he is taking some medication at home including previously prescribed tramadol and Tylenol 3.      Review of Systems   Objective: Vital Signs: There were no vitals taken for this visit.  Physical Exam Constitutional:      Appearance: Normal appearance.  Cardiovascular:     Rate and Rhythm: Normal rate.     Pulses: Normal pulses.  Pulmonary:     Effort: Pulmonary effort is normal.  Skin:    General: Skin is warm and dry.     Capillary Refill: Capillary refill takes less than 2 seconds.  Neurological:     Mental Status: He is alert.     Right Elbow Exam   Tenderness  Right elbow tenderness location: Tenderness around previous wound.   Phillip  Erythema: absent Sensation: normal Pulse: present  Comments:  Wound hemostatic.  Mild dehiscence.  Hematoma in wound bed.  Swelling seems improved from his initial visit.  Active wrist and finger extension intact.       Specialty Comments:  No specialty comments available.  Imaging: No results found.   PMFS History: Patient Active Problem List   Diagnosis Date Noted   Laceration of right forearm 05/11/2022   Chest pain 02/26/2022   Abnormal ECG 02/26/2022   Essential hypertension 02/26/2022   Pulmonary nodule 02/26/2022   Polysubstance dependence including opioid type drug, episodic abuse (HCC) 03/25/2012   TENDINITIS, SHOULDER, LEFT 09/14/2008   TOBACCO ABUSE 12/19/2007   CHEST WALL PAIN, ACUTE 12/11/2007   BACK PAIN 09/30/2007   Past Medical History:  Diagnosis Date   Allergic    BACK PAIN 09/30/2007   Qualifier: Diagnosis of  By: Tawanna Cooler MD, Tinnie Gens A    Chest pain    CHEST WALL PAIN, ACUTE 12/11/2007   Qualifier: Diagnosis of  By: Tawanna Cooler MD, Eugenio Hoes    Depression    Esophagitis    Essential  hypertension 02/26/2022   Folliculitis    Lumbar strain    No pertinent past medical history    Pain in anterior left upper extremity    Polysubstance dependence including opioid type drug, episodic abuse (HCC)    Pulmonary nodule 02/26/2022   TENDINITIS, SHOULDER, LEFT 09/14/2008   Qualifier: Diagnosis of  By: Tawanna Cooler MD, Eugenio Hoes    Testicular pain    TOBACCO ABUSE 12/19/2007   Qualifier: Diagnosis of  By: Tawanna Cooler MD, Eugenio Hoes     No family history on file.  Past Surgical History:  Procedure Laterality Date   ARTERY AND TENDON REPAIR Left 07/21/2016   Procedure: LEFT ARM EXPLORATION;  Surgeon: Knute Neu, MD;  Location: [facility] OR;  Service: Orthopedics;  Laterality: Left;   Social History   Occupational History   Not on file  Tobacco Use   Smoking status: Every Day    Packs/day: 1.00    Types: Cigarettes    Start date: 10   Smokeless tobacco: Never  Vaping Use   Vaping Use: Never used  Substance and Sexual Activity   Alcohol use: Not Currently    Alcohol/week: 2.0 standard drinks of alcohol    Types: 2 Cans of beer per week   Drug use: Yes    Types: Cocaine   Sexual activity: Yes    Partners: Female    Birth control/protection: None

## 2022-05-21 NOTE — Progress Notes (Signed)
 Office Visit Note   Patient: Phillip Anderson           Date of Birth: 02/05/1981           MRN: [id number] Visit Date: 05/21/2022              Requested by: Boscia, Heather E, NP [address] [place],  [place] [address] PCP: Boscia, Heather E, NP   Assessment & Plan: Visit Diagnoses:  1. Laceration of right forearm, initial encounter     Plan: Patient has continued pain in the right elbow and forearm.  His swelling seems improved from when I saw him 10 days ago.  He has improved ROM of the wrist and fingers with intact PIN function.  He does have a hematoma at the lateral proximal forearm with mild dehiscence of the previously closed wound.  The wound is hemostatic.  There is no purulence. We discussed formal I&D in the operating room versus wound care at home with warm, soapy water and dry dressing changes.  He and his girlfriend would like to do wound care at home and will call in a few days if appearance of wound worsens.   Follow-Up Instructions: No follow-ups on file.   Orders:  No orders of the defined types were placed in this encounter.  No orders of the defined types were placed in this encounter.     Procedures: No procedures performed   Clinical Data: No additional findings.   Subjective: Chief Complaint  Patient presents with   Right Elbow - Pain, Follow-up    This is a 41-year-old right-hand-dominant male who presents forfollow-up of a right elbow laceration.  Patient was walking his dog approximately 14 days ago when the dog pulled the leash and patient sliced his right lateral proximal forearm on a metal object.  The wound was explored per the ER documentation and there was a laceration to the muscle belly but no tendon involvement.    Patient was seen in the ER the day after our previous visit for continued pain.  He also presented to the ER 2 days ago with the same issue.  His girlfriend notes that the swelling is much improved today.  He  describes continued severe pain in the entire extremity from the upper arm through the hand.  He has much improved active range of motion of the wrist and fingers today than at my initial evaluation.  He notes that he is taking some medication at home including previously prescribed tramadol and Tylenol 3.      Review of Systems   Objective: Vital Signs: There were no vitals taken for this visit.  Physical Exam Constitutional:      Appearance: Normal appearance.  Cardiovascular:     Rate and Rhythm: Normal rate.     Pulses: Normal pulses.  Pulmonary:     Effort: Pulmonary effort is normal.  Skin:    General: Skin is warm and dry.     Capillary Refill: Capillary refill takes less than 2 seconds.  Neurological:     Mental Status: He is alert.     Right Elbow Exam   Tenderness  Right elbow tenderness location: Tenderness around previous wound.   Other  Erythema: absent Sensation: normal Pulse: present  Comments:  Wound hemostatic.  Mild dehiscence.  Hematoma in wound bed.  Swelling seems improved from his initial visit.  Active wrist and finger extension intact.       Specialty Comments:    No specialty comments available.  Imaging: No results found.   PMFS History: Patient Active Problem List   Diagnosis Date Noted   Laceration of right forearm 05/11/2022   Chest pain 02/26/2022   Abnormal ECG 02/26/2022   Essential hypertension 02/26/2022   Pulmonary nodule 02/26/2022   Polysubstance dependence including opioid type drug, episodic abuse (HCC) 03/25/2012   TENDINITIS, SHOULDER, LEFT 09/14/2008   TOBACCO ABUSE 12/19/2007   CHEST WALL PAIN, ACUTE 12/11/2007   BACK PAIN 09/30/2007   Past Medical History:  Diagnosis Date   Allergic    BACK PAIN 09/30/2007   Qualifier: Diagnosis of  By: Tawanna Cooler MD, Tinnie Gens A    Chest pain    CHEST WALL PAIN, ACUTE 12/11/2007   Qualifier: Diagnosis of  By: Tawanna Cooler MD, Eugenio Hoes    Depression    Esophagitis    Essential  hypertension 02/26/2022   Folliculitis    Lumbar strain    No pertinent past medical history    Pain in anterior left upper extremity    Polysubstance dependence including opioid type drug, episodic abuse (HCC)    Pulmonary nodule 02/26/2022   TENDINITIS, SHOULDER, LEFT 09/14/2008   Qualifier: Diagnosis of  By: Tawanna Cooler MD, Eugenio Hoes    Testicular pain    TOBACCO ABUSE 12/19/2007   Qualifier: Diagnosis of  By: Tawanna Cooler MD, Eugenio Hoes     No family history on file.  Past Surgical History:  Procedure Laterality Date   ARTERY AND TENDON REPAIR Left 07/21/2016   Procedure: LEFT ARM EXPLORATION;  Surgeon: Knute Neu, MD;  Location: [facility] OR;  Service: Orthopedics;  Laterality: Left;   Social History   Occupational History   Not on file  Tobacco Use   Smoking status: Every Day    Packs/day: 1.00    Types: Cigarettes    Start date: 10   Smokeless tobacco: Never  Vaping Use   Vaping Use: Never used  Substance and Sexual Activity   Alcohol use: Not Currently    Alcohol/week: 2.0 standard drinks of alcohol    Types: 2 Cans of beer per week   Drug use: Yes    Types: Cocaine   Sexual activity: Yes    Partners: Female    Birth control/protection: None

## 2022-05-22 ENCOUNTER — Encounter (HOSPITAL_BASED_OUTPATIENT_CLINIC_OR_DEPARTMENT_OTHER): Payer: Self-pay | Admitting: Orthopedic Surgery

## 2022-05-22 ENCOUNTER — Other Ambulatory Visit: Payer: Self-pay

## 2022-05-22 ENCOUNTER — Telehealth: Payer: Self-pay | Admitting: Orthopedic Surgery

## 2022-05-22 ENCOUNTER — Encounter (HOSPITAL_COMMUNITY): Payer: Self-pay

## 2022-05-22 ENCOUNTER — Emergency Department (HOSPITAL_COMMUNITY)
Admission: EM | Admit: 2022-05-22 | Discharge: 2022-05-22 | Discharge status: Against Medical Advice | Payer: 59 | Attending: Emergency Medicine | Admitting: Emergency Medicine

## 2022-05-22 DIAGNOSIS — M7989 Other specified soft tissue disorders: Secondary | ICD-10-CM | POA: Insufficient documentation

## 2022-05-22 DIAGNOSIS — Z5321 Procedure and treatment not carried out due to patient leaving prior to being seen by health care provider: Secondary | ICD-10-CM | POA: Insufficient documentation

## 2022-05-22 DIAGNOSIS — M79601 Pain in right arm: Secondary | ICD-10-CM | POA: Insufficient documentation

## 2022-05-22 DIAGNOSIS — W01198D Fall on same level from slipping, tripping and stumbling with subsequent striking against other object, subsequent encounter: Secondary | ICD-10-CM | POA: Insufficient documentation

## 2022-05-22 LAB — CBC WITH DIFFERENTIAL/PLATELET
Abs Immature Granulocytes: 0.01 10*3/uL (ref 0.00–0.07)
Basophils Absolute: 0 10*3/uL (ref 0.0–0.1)
Basophils Relative: 0 %
Eosinophils Absolute: 0.1 10*3/uL (ref 0.0–0.5)
Eosinophils Relative: 2 %
HCT: 41.5 % (ref 39.0–52.0)
Hemoglobin: 13.9 g/dL (ref 13.0–17.0)
Immature Granulocytes: 0 %
Lymphocytes Relative: 26 %
Lymphs Abs: 1.5 10*3/uL (ref 0.7–4.0)
MCH: 31.7 pg (ref 26.0–34.0)
MCHC: 33.5 g/dL (ref 30.0–36.0)
MCV: 94.5 fL (ref 80.0–100.0)
Monocytes Absolute: 0.3 10*3/uL (ref 0.1–1.0)
Monocytes Relative: 5 %
Neutro Abs: 3.8 10*3/uL (ref 1.7–7.7)
Neutrophils Relative %: 67 %
Platelets: 202 10*3/uL (ref 150–400)
RBC: 4.39 MIL/uL (ref 4.22–5.81)
RDW: 13.2 % (ref 11.5–15.5)
WBC: 5.7 10*3/uL (ref 4.0–10.5)
nRBC: 0 % (ref 0.0–0.2)

## 2022-05-22 LAB — COMPREHENSIVE METABOLIC PANEL
ALT: 11 U/L (ref 0–44)
AST: 14 U/L — ABNORMAL LOW (ref 15–41)
Albumin: 3.9 g/dL (ref 3.5–5.0)
Alkaline Phosphatase: 58 U/L (ref 38–126)
Anion gap: 6 (ref 5–15)
BUN: 6 mg/dL (ref 6–20)
CO2: 26 mmol/L (ref 22–32)
Calcium: 8.8 mg/dL — ABNORMAL LOW (ref 8.9–10.3)
Chloride: 106 mmol/L (ref 98–111)
Creatinine, Ser: 0.88 mg/dL (ref 0.61–1.24)
GFR, Estimated: >60 mL/min (ref >60)
Glucose, Bld: 86 mg/dL (ref 70–99)
Potassium: 4.4 mmol/L (ref 3.5–5.1)
Sodium: 138 mmol/L (ref 135–145)
Total Bilirubin: 0.6 mg/dL (ref 0.3–1.2)
Total Protein: 6.5 g/dL (ref 6.5–8.1)

## 2022-05-22 LAB — LACTIC ACID, PLASMA: Lactic Acid, Venous: 1.1 mmol/L (ref 0.5–1.9)

## 2022-05-22 LAB — CK: Total CK: 100 U/L (ref 49–397)

## 2022-05-22 MED ORDER — OXYCODONE-ACETAMINOPHEN 5-325 MG PO TABS
1.0000 | ORAL_TABLET | Freq: Once | ORAL | Status: AC
  Administered 2022-05-22: 1 via ORAL
  Filled 2022-05-22: qty 1

## 2022-05-22 NOTE — ED Notes (Signed)
Pt left AMA after pain med admin. States "I am getting really f'ing frustrated. I get frustrated when I am in pain". Pt got up and walked out. Refused to sign AMA ppw.

## 2022-05-22 NOTE — ED Provider Triage Note (Signed)
Emergency Medicine Provider Triage Evaluation Note  Phillip Anderson , a 41 y.o. male  was evaluated in triage.  Pt complains of pain in the right proximal forearm. He was seen recently after he cut this area while walking the dog.  He has been seen by Dr. Sandra Cockayne for this. As of yesterday the plan was to try wound care at home, however patient states his pain has been significantly worsening.  He denies any fevers.  He feels like his swelling is worsened since yesterday.   Physical Exam  BP (!) 151/118 (BP Location: Left Arm)   Pulse 65   Temp 98.7 F (37.1 C) (Oral)   Resp 16   Ht 5\' 10"  (1.778 m)   Wt 106.6 kg   SpO2 100%   BMI 33.72 kg/m  Gen:   Awake, no distress   Resp:  Normal effort  MSK:   Moves extremities without difficulty, is able to flex and extend right elbow.  There is obvious edema around his wound on the right proximal forearm.  Compartments in the forearm are soft and compressible Other:  2+ right radial pulse.  Subjective decrease sensation to light touch over right hand.  He has significant pain over the entire right forearm with range of motion of the wrist.  Medical Decision Making  Medically screening exam initiated at 2:35 PM.  Appropriate orders placed.  was informed that the remainder of the evaluation will be completed by another provider, this initial triage assessment does not replace that evaluation, and the importance of remaining in the ED until their evaluation is complete.  We will check basic labs in case patient does require operative debridement. We will clinically have a low suspicion for compartment syndrome we will also check a lactic and a CK. NPO   Sallyanne Kuster, PA-C 05/22/22 1440

## 2022-05-22 NOTE — Telephone Encounter (Signed)
Pt's girlfriend Lanora Manis called requesting a call back from Dr. Frazier Butt concerning pt's open wound. Please call her at [phone].

## 2022-05-22 NOTE — ED Triage Notes (Signed)
Pt arrived POV from home c/o right arm pain. Pt fell had cut his arm open had had stitches placed on 7/12. Pt has not been evaluated since, but now pt's arm is swollen, red, and tender to touch.

## 2022-05-23 ENCOUNTER — Ambulatory Visit (HOSPITAL_BASED_OUTPATIENT_CLINIC_OR_DEPARTMENT_OTHER): Payer: 59 | Admitting: Anesthesiology

## 2022-05-23 ENCOUNTER — Other Ambulatory Visit: Payer: Self-pay

## 2022-05-23 ENCOUNTER — Ambulatory Visit (HOSPITAL_BASED_OUTPATIENT_CLINIC_OR_DEPARTMENT_OTHER)
Admission: RE | Admit: 2022-05-23 | Discharge: 2022-05-23 | Disposition: A | Discharge status: Home / Self Care | Payer: 59 | Attending: Orthopedic Surgery | Admitting: Orthopedic Surgery

## 2022-05-23 ENCOUNTER — Encounter (HOSPITAL_BASED_OUTPATIENT_CLINIC_OR_DEPARTMENT_OTHER)
Admission: RE | Disposition: A | Discharge status: Home / Self Care | Payer: Self-pay | Source: Home / Self Care | Attending: Orthopedic Surgery

## 2022-05-23 ENCOUNTER — Encounter (HOSPITAL_BASED_OUTPATIENT_CLINIC_OR_DEPARTMENT_OTHER): Payer: Self-pay | Admitting: Orthopedic Surgery

## 2022-05-23 DIAGNOSIS — L7632 Postprocedural hematoma of skin and subcutaneous tissue following other procedure: Secondary | ICD-10-CM | POA: Diagnosis not present

## 2022-05-23 DIAGNOSIS — S51802A Unspecified open wound of left forearm, initial encounter: Secondary | ICD-10-CM

## 2022-05-23 DIAGNOSIS — Y93K1 Activity, walking an animal: Secondary | ICD-10-CM | POA: Insufficient documentation

## 2022-05-23 DIAGNOSIS — I10 Essential (primary) hypertension: Secondary | ICD-10-CM

## 2022-05-23 DIAGNOSIS — S56521A Laceration of other extensor muscle, fascia and tendon at forearm level, right arm, initial encounter: Secondary | ICD-10-CM | POA: Diagnosis not present

## 2022-05-23 DIAGNOSIS — F17210 Nicotine dependence, cigarettes, uncomplicated: Secondary | ICD-10-CM | POA: Insufficient documentation

## 2022-05-23 DIAGNOSIS — S51811A Laceration without foreign body of right forearm, initial encounter: Secondary | ICD-10-CM | POA: Diagnosis not present

## 2022-05-23 DIAGNOSIS — F17200 Nicotine dependence, unspecified, uncomplicated: Secondary | ICD-10-CM | POA: Diagnosis not present

## 2022-05-23 DIAGNOSIS — S56522A Laceration of other extensor muscle, fascia and tendon at forearm level, left arm, initial encounter: Secondary | ICD-10-CM

## 2022-05-23 HISTORY — PX: I & D EXTREMITY: SHX5045

## 2022-05-23 SURGERY — IRRIGATION AND DEBRIDEMENT EXTREMITY
Anesthesia: General | Site: Arm Lower | Laterality: Right

## 2022-05-23 MED ORDER — HYDROMORPHONE HCL 1 MG/ML IJ SOLN
INTRAMUSCULAR | Status: AC
  Filled 2022-05-23: qty 0.5

## 2022-05-23 MED ORDER — PROPOFOL 10 MG/ML IV BOLUS
INTRAVENOUS | Status: AC
  Filled 2022-05-23: qty 20

## 2022-05-23 MED ORDER — LACTATED RINGERS IV SOLN: INTRAVENOUS | Status: DC

## 2022-05-23 MED ORDER — DEXAMETHASONE SODIUM PHOSPHATE 10 MG/ML IJ SOLN
INTRAMUSCULAR | Status: DC | PRN
  Administered 2022-05-23: 10 mg via INTRAVENOUS

## 2022-05-23 MED ORDER — CEFAZOLIN SODIUM-DEXTROSE 2-4 GM/100ML-% IV SOLN
2.0000 g | INTRAVENOUS | Status: AC
  Administered 2022-05-23: 2 g via INTRAVENOUS

## 2022-05-23 MED ORDER — SODIUM CHLORIDE 0.9 % IR SOLN
Status: DC | PRN
  Administered 2022-05-23: 1000 mL

## 2022-05-23 MED ORDER — BUPIVACAINE HCL (PF) 0.25 % IJ SOLN
INTRAMUSCULAR | Status: DC | PRN
  Administered 2022-05-23: 10 mL

## 2022-05-23 MED ORDER — MIDAZOLAM HCL 2 MG/2ML IJ SOLN
INTRAMUSCULAR | Status: AC
  Filled 2022-05-23: qty 2

## 2022-05-23 MED ORDER — OXYCODONE HCL 5 MG PO TABS
ORAL_TABLET | ORAL | Status: AC
  Filled 2022-05-23: qty 1

## 2022-05-23 MED ORDER — OXYCODONE HCL 5 MG PO TABS
5.0000 mg | ORAL_TABLET | Freq: Once | ORAL | Status: AC
  Administered 2022-05-23: 5 mg via ORAL

## 2022-05-23 MED ORDER — ONDANSETRON HCL 4 MG/2ML IJ SOLN
INTRAMUSCULAR | Status: DC | PRN
  Administered 2022-05-23: 4 mg via INTRAVENOUS

## 2022-05-23 MED ORDER — DEXMEDETOMIDINE (PRECEDEX) IN NS 20 MCG/5ML (4 MCG/ML) IV SYRINGE
PREFILLED_SYRINGE | INTRAVENOUS | Status: DC | PRN
  Administered 2022-05-23: 8 ug via INTRAVENOUS
  Administered 2022-05-23: 12 ug via INTRAVENOUS

## 2022-05-23 MED ORDER — MIDAZOLAM HCL 5 MG/5ML IJ SOLN
INTRAMUSCULAR | Status: DC | PRN
  Administered 2022-05-23: 2 mg via INTRAVENOUS

## 2022-05-23 MED ORDER — PROPOFOL 10 MG/ML IV BOLUS
INTRAVENOUS | Status: DC | PRN
  Administered 2022-05-23: 200 mg via INTRAVENOUS

## 2022-05-23 MED ORDER — FENTANYL CITRATE (PF) 100 MCG/2ML IJ SOLN
INTRAMUSCULAR | Status: AC
  Filled 2022-05-23: qty 2

## 2022-05-23 MED ORDER — HYDROMORPHONE HCL 1 MG/ML IJ SOLN
0.2500 mg | INTRAMUSCULAR | Status: DC | PRN
  Administered 2022-05-23 (×3): 0.5 mg via INTRAVENOUS

## 2022-05-23 MED ORDER — LIDOCAINE 2% (20 MG/ML) 5 ML SYRINGE
INTRAMUSCULAR | Status: AC
  Filled 2022-05-23: qty 5

## 2022-05-23 MED ORDER — OXYCODONE HCL 5 MG PO TABS: 5.0000 mg | ORAL_TABLET | Freq: Four times a day (QID) | ORAL | 0 refills | Status: DC | PRN

## 2022-05-23 MED ORDER — FENTANYL CITRATE (PF) 100 MCG/2ML IJ SOLN
INTRAMUSCULAR | Status: DC | PRN
  Administered 2022-05-23 (×4): 50 ug via INTRAVENOUS

## 2022-05-23 MED ORDER — LIDOCAINE HCL (CARDIAC) PF 100 MG/5ML IV SOSY
PREFILLED_SYRINGE | INTRAVENOUS | Status: DC | PRN
  Administered 2022-05-23: 80 mg via INTRAVENOUS

## 2022-05-23 MED ORDER — CEFAZOLIN SODIUM-DEXTROSE 2-4 GM/100ML-% IV SOLN
INTRAVENOUS | Status: AC
  Filled 2022-05-23: qty 100

## 2022-05-23 SURGICAL SUPPLY — 53 items
APL PRP STRL LF DISP 70% ISPRP (MISCELLANEOUS) ×1
BLADE SURG 15 STRL LF DISP TIS (BLADE) ×1 IMPLANT
BLADE SURG 15 STRL SS (BLADE) ×4
BNDG CMPR 9X4 STRL LF SNTH (GAUZE/BANDAGES/DRESSINGS) ×1
BNDG ELASTIC 3X5.8 VLCR STR LF (GAUZE/BANDAGES/DRESSINGS) ×3 IMPLANT
BNDG ESMARK 4X9 LF (GAUZE/BANDAGES/DRESSINGS) ×2 IMPLANT
BNDG GAUZE DERMACEA FLUFF (GAUZE/BANDAGES/DRESSINGS) ×1
BNDG GAUZE DERMACEA FLUFF 4 (GAUZE/BANDAGES/DRESSINGS) ×1 IMPLANT
BNDG GZE DERMACEA 4 6PLY (GAUZE/BANDAGES/DRESSINGS) ×1
CHLORAPREP W/TINT 26 (MISCELLANEOUS) ×2 IMPLANT
CORD BIPOLAR FORCEPS 12FT (ELECTRODE) ×2 IMPLANT
COVER BACK TABLE 60X90IN (DRAPES) ×2 IMPLANT
COVER MAYO STAND STRL (DRAPES) ×2 IMPLANT
CUFF TOURN SGL QUICK 18X4 (TOURNIQUET CUFF) ×1 IMPLANT
CUFF TOURN SGL QUICK 24 (TOURNIQUET CUFF)
CUFF TRNQT CYL 24X4X16.5-23 (TOURNIQUET CUFF) IMPLANT
DRAIN PENROSE .5X12 LATEX STL (DRAIN) ×1 IMPLANT
DRAPE EXTREMITY T 121X128X90 (DISPOSABLE) ×2 IMPLANT
DRAPE SURG 17X23 STRL (DRAPES) ×2 IMPLANT
DRSG PAD ABDOMINAL 8X10 ST (GAUZE/BANDAGES/DRESSINGS) ×1 IMPLANT
ELECT REM PT RETURN 9FT ADLT (ELECTROSURGICAL) ×2
ELECTRODE REM PT RTRN 9FT ADLT (ELECTROSURGICAL) IMPLANT
GAUZE 4X4 16PLY ~~LOC~~+RFID DBL (SPONGE) ×1 IMPLANT
GAUZE SPONGE 4X4 12PLY STRL (GAUZE/BANDAGES/DRESSINGS) IMPLANT
GAUZE XEROFORM 1X8 LF (GAUZE/BANDAGES/DRESSINGS) ×2 IMPLANT
GLOVE BIO SURGEON STRL SZ 6.5 (GLOVE) ×1 IMPLANT
GLOVE BIO SURGEON STRL SZ7 (GLOVE) ×2 IMPLANT
GLOVE BIOGEL PI IND STRL 7.0 (GLOVE) ×1 IMPLANT
GLOVE BIOGEL PI INDICATOR 7.0 (GLOVE) ×3
GOWN STRL REUS W/ TWL LRG LVL3 (GOWN DISPOSABLE) ×1 IMPLANT
GOWN STRL REUS W/TWL LRG LVL3 (GOWN DISPOSABLE) ×4
GOWN STRL REUS W/TWL XL LVL3 (GOWN DISPOSABLE) ×1 IMPLANT
HEMOSTAT ARISTA ABSORB 3G PWDR (HEMOSTASIS) ×1 IMPLANT
NDL HYPO 25X1 1.5 SAFETY (NEEDLE) ×1 IMPLANT
NEEDLE HYPO 25X1 1.5 SAFETY (NEEDLE) ×2 IMPLANT
NS IRRIG 1000ML POUR BTL (IV SOLUTION) ×2 IMPLANT
PACK BASIN DAY SURGERY FS (CUSTOM PROCEDURE TRAY) ×2 IMPLANT
PAD CAST 3X4 CTTN HI CHSV (CAST SUPPLIES) IMPLANT
PADDING CAST COTTON 3X4 STRL (CAST SUPPLIES) ×2
PENCIL SMOKE EVACUATOR (MISCELLANEOUS) ×1 IMPLANT
SET IRRIG Y TYPE TUR BLADDER L (SET/KITS/TRAYS/PACK) ×1 IMPLANT
SLEEVE SCD COMPRESS KNEE MED (STOCKING) IMPLANT
SPONGE T-LAP 4X18 ~~LOC~~+RFID (SPONGE) ×1 IMPLANT
SUCTION FRAZIER HANDLE 10FR (MISCELLANEOUS) ×2
SUCTION TUBE FRAZIER 10FR DISP (MISCELLANEOUS) IMPLANT
SUT ETHILON 4 0 PS 2 18 (SUTURE) ×3 IMPLANT
SUT MNCRL AB 3-0 PS2 18 (SUTURE) ×1 IMPLANT
SUT VICRYL 4-0 PS2 18IN ABS (SUTURE) IMPLANT
SYR BULB EAR ULCER 3OZ GRN STR (SYRINGE) ×2 IMPLANT
SYR CONTROL 10ML LL (SYRINGE) ×2 IMPLANT
TOWEL GREEN STERILE FF (TOWEL DISPOSABLE) ×4 IMPLANT
TUBE CONNECTING 20X1/4 (TUBING) ×1 IMPLANT
UNDERPAD 30X36 HEAVY ABSORB (UNDERPADS AND DIAPERS) ×2 IMPLANT

## 2022-05-23 NOTE — Op Note (Signed)
Date of Surgery: 05/23/2022  INDICATIONS: Patient is a 41 y.o.-year-old male with a laceration of the left proximal aspect of the dorsal forearm after being pulled by his dog on a leash approximately 2 weeks ago.  He was seen in the ER where the wound was irrigated and closed at the direction of another hand surgeon.  Patient was unable to follow-up with the individual on-call and was seen in my office 2 days later.  At that time, the wound was hemostatic.  He had focal swelling over the laceration.  The wound edges were well approximated.  He returned to my office the following week with dehiscence of the wound and slightly improved swelling.  He continued to complain of significant pain.  We discussed irrigation and debridement with wound closure at that time with the patient and his significant other wishing to proceed with wound care.  The wound continued to open and we decided to proceed with irrigation and debridement of the wound today.  Risks, benefits, and alternatives to surgery were again discussed with the patient in the preoperative area. The patient wishes to proceed with surgery.  Informed consent was signed after our discussion.   PREOPERATIVE DIAGNOSIS:  Laceration of extensor tendons at level of muscle belly Wound hematoma  POSTOPERATIVE DIAGNOSIS: Same.  PROCEDURE:  Irrigation and excisional debridement of open wound (skin, subcutaneous tissue approx, 6 x 2 cm in area) Evacuation of forearm hematoma   SURGEON: Audria Nine, M.D.  ASSIST:   ANESTHESIA:  general  IV FLUIDS AND URINE: See anesthesia.  ESTIMATED BLOOD LOSS: 15 mL.  IMPLANTS: * No implants in log *   DRAINS: Penrose drain x 1  COMPLICATIONS: None  DESCRIPTION OF PROCEDURE: The patient was met in the preoperative holding area where the surgical site was marked and the consent form was verified.  The patient was then taken to the operating room and transferred to the operating table.  All bony  prominences were well padded.  A tourniquet was applied to the left upper arm.  General endotracheal anesthesia was induced.  The operative extremity was prepped and draped in the usual and sterile fashion.  A formal time-out was performed to confirm that this was the correct patient, surgery, side, and site.   Following timeout, the previously placed sutures were removed.  There was old hematoma in the wound bed.  The wound was hemostatic.  There was a longitudinal laceration in the common extensor tendons at the level of the muscle bellies in line with the skin laceration.  There was some undermining of the skin distal to the laceration in the area of the previous hematoma.  This resulted also decompressed and the hematoma evacuated.  Muscle bellies were pink and contractile with Bovie stimulation.  No evidence of injury to the underlying lateral collateral ligament complex with no posterolateral rotary instability or no instability with varus stress of the elbow.  There is no obvious laceration of major neurovascular structures.  Following evacuation of the hematoma, there was diffuse bleeding from the wound bed at the extensor muscle bellies.  There was no evidence of arterial bleeding.  The wound edges were sharply debrided of skin and subcutaneous tissue to allow for better wound approximation and wound healing.  The area of skin and subcutaneous tissue debridement was approximately 6 cm in length and 2 cm in width.  The wound was extended approximately 3 cm distally to further evaluate the common extensor tendons.  The wound was then thoroughly  irrigated with copious sterile saline via low flow cystoscopy tubing.  Direct pressure was held on the wound for hemostasis.  There remained diffuse bleeding from the wound bed.  Arista hemostatic powder was applied to the wound bed for hemostasis of the punctate bleeding.  Direct pressure was then held on the wound.  With hemostasis achieved, the wound was closed.   The skin was closed using a 4-0 nylon suture in a simple fashion.  Local anesthesia was provided with 10 cc of core percent plain Marcaine.  1/2 inch Penrose drain was placed in the wound to allow for decompression to prevent reaccumulation of hematoma or blood products.  The arm was then cleaned and the wound dressed with Xeroform, folded Kerlix, ABDs, cast padding, and an Ace wrap.  Patient was then reversed of anesthesia and extubated uneventfully.  Was transferred to the postoperative bed.  All counts were correct at the end the procedure.  The patient was then taken the PACU in stable condition.   POSTOPERATIVE PLAN: Patient be discharged home with appropriate pain medication and discharge instructions.  I will see him back in the office within a week for a wound evaluation.  Audria Nine, MD 3:33 PM

## 2022-05-23 NOTE — Anesthesia Postprocedure Evaluation (Signed)
Anesthesia Post Note  Patient: Phillip Anderson  Procedure(s) Performed: IRRIGATION AND DEBRIDEMENT RIGHT ARM (Right: Arm Lower)     Patient location during evaluation: PACU Anesthesia Type: General Level of consciousness: awake Pain management: pain level controlled Vital Signs Assessment: post-procedure vital signs reviewed and stable Respiratory status: spontaneous breathing Cardiovascular status: stable Postop Assessment: no apparent nausea or vomiting Anesthetic complications: no   No notable events documented.  Last Vitals:  Vitals:   05/23/22 1203  BP: (!) 146/96  Pulse: (!) 54  Resp: 14  Temp: 36.7 C  SpO2: 100%    Last Pain:  Vitals:   05/23/22 1541  TempSrc:   PainSc: (P) 10-Worst pain ever                 Shalom Ware

## 2022-05-23 NOTE — Anesthesia Preprocedure Evaluation (Addendum)
Anesthesia Evaluation  Patient identified by MRN, date of birth, ID band Patient awake    Reviewed: Allergy & Precautions, NPO status , Patient's Chart, lab work & pertinent test results  Airway Mallampati: II  TM Distance: >3 FB     Dental   Pulmonary Current Smoker and Patient abstained from smoking.,    breath sounds clear to auscultation       Cardiovascular hypertension,  Rhythm:Regular Rate:Normal     Neuro/Psych PSYCHIATRIC DISORDERS  Neuromuscular disease    GI/Hepatic negative GI ROS, Neg liver ROS,   Endo/Other    Renal/GU negative Renal ROS     Musculoskeletal   Abdominal   Peds  Hematology   Anesthesia Other Findings   Reproductive/Obstetrics                             Anesthesia Physical Anesthesia Plan  ASA: 3  Anesthesia Plan: General   Post-op Pain Management:    Induction:   PONV Risk Score and Plan: 2 and Treatment may vary due to age or medical condition  Airway Management Planned: LMA  Additional Equipment:   Intra-op Plan:   Post-operative Plan: Extubation in OR  Informed Consent: I have reviewed the patients History and Physical, chart, labs and discussed the procedure including the risks, benefits and alternatives for the proposed anesthesia with the patient or authorized representative who has indicated his/her understanding and acceptance.     Dental advisory given  Plan Discussed with: Anesthesiologist and CRNA  Anesthesia Plan Comments:         Anesthesia Quick Evaluation

## 2022-05-23 NOTE — Transfer of Care (Signed)
Immediate Anesthesia Transfer of Care Note  Patient: Phillip Anderson  Procedure(s) Performed: IRRIGATION AND DEBRIDEMENT RIGHT ARM (Right: Arm Lower)  Patient Location: PACU  Anesthesia Type:General  Level of Consciousness: awake, alert  and oriented  Airway & Oxygen Therapy: Patient Spontanous Breathing and Patient connected to face mask oxygen  Post-op Assessment: Report given to RN and Post -op Vital signs reviewed and stable  Post vital signs: Reviewed and stable  Last Vitals:  Vitals Value Taken Time  BP 161/117 05/23/22 1536  Temp    Pulse 70 05/23/22 1540  Resp 11 05/23/22 1540  SpO2 99 % 05/23/22 1540  Vitals shown include unvalidated device data.  Last Pain:  Vitals:   05/23/22 1203  TempSrc: Oral  PainSc: 10-Worst pain ever      Patients Stated Pain Goal: 10 (05/23/22 1203)  Complications: No notable events documented.

## 2022-05-23 NOTE — Anesthesia Procedure Notes (Signed)
Procedure Name: LMA Insertion Date/Time: 05/23/2022 2:18 PM  Performed by: Burna Cash, CRNAPre-anesthesia Checklist: Patient identified, Emergency Drugs available, Suction available and Patient being monitored Patient Re-evaluated:Patient Re-evaluated prior to induction Oxygen Delivery Method: Circle system utilized Preoxygenation: Pre-oxygenation with 100% oxygen Induction Type: IV induction Ventilation: Mask ventilation without difficulty LMA: LMA inserted LMA Size: 5.0 Number of attempts: 1 Airway Equipment and Method: Bite block Placement Confirmation: positive ETCO2 Tube secured with: Tape Dental Injury: Teeth and Oropharynx as per pre-operative assessment

## 2022-05-23 NOTE — Discharge Instructions (Signed)
  Phillip Anderson, M.D. Hand Surgery  POST-OPERATIVE DISCHARGE INSTRUCTIONS   PRESCRIPTIONS: You may have been given a prescription to be taken as directed for post-operative pain control.  You may also take over the counter ibuprofen/aleve and tylenol for pain. Take this as directed on the packaging. Do not exceed 3000 mg tylenol/acetaminophen in 24 hours.  Ibuprofen 600-800 mg (3-4) tablets by mouth every 6 hours as needed for pain.  OR Aleve 2 tablets by mouth every 12 hours (twice daily) as needed for pain.  AND/OR Tylenol 1000 mg (2 tablets) every 8 hours as needed for pain.  Please use your pain medication carefully, as refills are limited and you may not be provided with one.  As stated above, please use over the counter pain medicine - it will also be helpful with decreasing your swelling.    ANESTHESIA: After your surgery, post-surgical discomfort or pain is likely. This discomfort can last several days to a few weeks. At certain times of the day your discomfort may be more intense.   Did you receive a nerve block?  A nerve block can provide pain relief for one hour to two days after your surgery. As long as the nerve block is working, you will experience little or no sensation in the area the surgeon operated on.  As the nerve block wears off, you will begin to experience pain or discomfort. It is very important that you begin taking your prescribed pain medication before the nerve block fully wears off. Treating your pain at the first sign of the block wearing off will ensure your pain is better controlled and more tolerable when full-sensation returns. Do not wait until the pain is intolerable, as the medicine will be less effective. It is better to treat pain in advance than to try and catch up.   General Anesthesia:  If you did not receive a nerve block during your surgery, you will need to start taking your pain medication shortly after your surgery and should continue  to do so as prescribed by your surgeon.     ICE AND ELEVATION: You may use ice for the first 48-72 hours, but it is not critical.   Motion of your fingers is very important to decrease the swelling.  Elevation, as much as possible for the next 48 hours, is critical for decreasing swelling as well as for pain relief. Elevation means when you are seated or lying down, you hand should be at or above your heart. When walking, the hand needs to be at or above the level of your elbow.  If the bandage gets too tight, it may need to be loosened. Please contact our office and we will instruct you in how to do this.    SURGICAL BANDAGES:  Keep your dressing and/or splint clean and dry at all times.  Do not remove until you are seen again in the office.  If careful, you may place a plastic bag over your bandage and tape the end to shower, but be careful, do not get your bandages wet.     HAND THERAPY:  You may not need any. If you do, we will begin this at your follow up visit in the clinic.    ACTIVITY AND WORK: You are encouraged to move any fingers which are not in the bandage.  Light use of the fingers is allowed to assist the other hand with daily hygiene and eating, but strong gripping or lifting is often uncomfortable and   should be avoided.  You might miss a variable period of time from work and hopefully this issue has been discussed prior to surgery. You may not do any heavy work with your affected hand for about 2 weeks.    [facility] [address] [place],  [place]  [address] [phone]  

## 2022-05-23 NOTE — Interval H&P Note (Signed)
History and Physical Interval Note:  05/23/2022 1:03 PM  Phillip Anderson  has presented today for surgery, with the diagnosis of right arm wound.  The various methods of treatment have been discussed with the patient and family. After consideration of risks, benefits and other options for treatment, the patient has consented to  Procedure(s): IRRIGATION AND DEBRIDEMENT RIGHT ARM (Right) as a surgical intervention.  The patient's history has been reviewed, patient examined, no change in status, stable for surgery.  I have reviewed the patient's chart and labs.  Questions were answered to the patient's satisfaction.     [name]

## 2022-05-24 ENCOUNTER — Encounter (HOSPITAL_BASED_OUTPATIENT_CLINIC_OR_DEPARTMENT_OTHER): Payer: Self-pay | Admitting: Orthopedic Surgery

## 2022-05-25 ENCOUNTER — Telehealth: Payer: Self-pay | Admitting: Orthopedic Surgery

## 2022-05-25 NOTE — Telephone Encounter (Signed)
Patient wife called in sitting patient is in severe pain medication that was prescribed is not working well please advise

## 2022-05-28 ENCOUNTER — Telehealth: Payer: Self-pay | Admitting: Orthopedic Surgery

## 2022-05-28 ENCOUNTER — Ambulatory Visit (INDEPENDENT_AMBULATORY_CARE_PROVIDER_SITE_OTHER): Payer: 59 | Admitting: Orthopedic Surgery

## 2022-05-28 DIAGNOSIS — S51802A Unspecified open wound of left forearm, initial encounter: Secondary | ICD-10-CM

## 2022-05-28 DIAGNOSIS — S56522A Laceration of other extensor muscle, fascia and tendon at forearm level, left arm, initial encounter: Secondary | ICD-10-CM

## 2022-05-28 MED ORDER — OXYCODONE HCL 5 MG PO TABS: 5.0000 mg | ORAL_TABLET | Freq: Four times a day (QID) | ORAL | 0 refills | Status: AC | PRN

## 2022-05-28 NOTE — Telephone Encounter (Signed)
Due to rx being 2 days early-the Pharmacy needed clearance from Dr. Benfield to go ahead and fill it today. I was able to get the clearance from Dr. Benfield. 

## 2022-05-28 NOTE — Progress Notes (Signed)
Post-Op Visit Note   Patient: Phillip Anderson           Date of Birth: [birth year]           MRN: [id number] Visit Date: 05/28/2022 PCP: Carlean Jews, NP   Assessment & Plan:  Chief Complaint:  Chief Complaint  Patient presents with   Right Elbow - Routine Post Op   Visit Diagnoses:  1. Extensor tendon laceration of left forearm with open wound, initial encounter     Plan: Patient now 5 days s/p I&D of open left proximal forearm laceration with laceration of underlying common extensor muscle belly.  There was large amount of hematoma present.  Penrose drain left in wound which has fallen out.  Wound remains clean, dry, and well approximated.  He has better ROM of his fingers and wrist today.  He will start gently washing the wound in the shower and apply a clean, dry bandage.  I can see him back next week.   Follow-Up Instructions: No follow-ups on file.   Orders:  No orders of the defined types were placed in this encounter.  Meds ordered this encounter  Medications   oxyCODONE (ROXICODONE) 5 MG immediate release tablet    Sig: Take 1 tablet (5 mg total) by mouth every 6 (six) hours as needed for up to 3 days for severe pain.    Dispense:  12 tablet    Refill:  0    Imaging: No results found.  PMFS History: Patient Active Problem List   Diagnosis Date Noted   Extensor tendon laceration of left forearm with open wound    Laceration of right forearm 05/11/2022   Chest pain 02/26/2022   Abnormal ECG 02/26/2022   Essential hypertension 02/26/2022   Pulmonary nodule 02/26/2022   Polysubstance dependence including opioid type drug, episodic abuse (HCC) 03/25/2012   TENDINITIS, SHOULDER, LEFT 09/14/2008   TOBACCO ABUSE 12/19/2007   CHEST WALL PAIN, ACUTE 12/11/2007   BACK PAIN 09/30/2007   Past Medical History:  Diagnosis Date   Allergic    BACK PAIN 09/30/2007   Qualifier: Diagnosis of  By: Tawanna Cooler MD, Tinnie Gens A    Chest pain    CHEST WALL PAIN, ACUTE  12/11/2007   Qualifier: Diagnosis of  By: Tawanna Cooler MD, Tinnie Gens A    Depression    Esophagitis    Essential hypertension 02/26/2022   Folliculitis    Lumbar strain    No pertinent past medical history    Pain in anterior left upper extremity    Polysubstance dependence including opioid type drug, episodic abuse (HCC)    Pulmonary nodule 02/26/2022   TENDINITIS, SHOULDER, LEFT 09/14/2008   Qualifier: Diagnosis of  By: Tawanna Cooler MD, Eugenio Hoes    Testicular pain    TOBACCO ABUSE 12/19/2007   Qualifier: Diagnosis of  By: Tawanna Cooler MD, Eugenio Hoes     No family history on file.  Past Surgical History:  Procedure Laterality Date   ARTERY AND TENDON REPAIR Left 07/21/2016   Procedure: LEFT ARM EXPLORATION;  Surgeon: Knute Neu, MD;  Location: [facility] OR;  Service: Orthopedics;  Laterality: Left;   I & D EXTREMITY Right 05/23/2022   Procedure: IRRIGATION AND DEBRIDEMENT RIGHT ARM;  Surgeon: Marlyne Beards, MD;  Location: [facility];  Service: Orthopedics;  Laterality: Right;   Social History   Occupational History   Not on file  Tobacco Use   Smoking status: Every Day    Packs/day: 1.00  Types: Cigarettes    Start date: 1993   Smokeless tobacco: Never  Vaping Use   Vaping Use: Never used  Substance and Sexual Activity   Alcohol use: Not Currently    Alcohol/week: 2.0 standard drinks of alcohol    Types: 2 Cans of beer per week   Drug use: Yes    Types: Cocaine, Marijuana    Comment: last cocaine "last week", last marijuana "a week or 2"   Sexual activity: Yes    Partners: Female    Birth control/protection: None

## 2022-05-28 NOTE — Telephone Encounter (Signed)
Due to rx being 2 days early-the Pharmacy needed clearance from Dr. Frazier Butt to go ahead and fill it today. I was able to get the clearance from Dr. Frazier Butt.

## 2022-05-28 NOTE — Telephone Encounter (Signed)
Pt called and states they are unable to pick up oxycodone prescription. Please advise

## 2022-05-28 NOTE — Telephone Encounter (Signed)
Patient's girlfriend called. The pharmacy needs the oxycodone called in for him. The call back is [phone]

## 2022-05-31 ENCOUNTER — Encounter: Payer: 59 | Admitting: Orthopedic Surgery

## 2022-06-04 ENCOUNTER — Telehealth: Payer: Self-pay | Admitting: Orthopedic Surgery

## 2022-06-04 NOTE — Telephone Encounter (Signed)
How long will he be out of work?

## 2022-06-04 NOTE — Telephone Encounter (Signed)
Patient's friend Lanora Manis called asked if a letter can be faxed to patient's employer stating when he return to work without restrictions.  The fax number  is [phone]. Attention HR    The number to contact patient is [phone] or [phone]

## 2022-06-11 ENCOUNTER — Ambulatory Visit (INDEPENDENT_AMBULATORY_CARE_PROVIDER_SITE_OTHER): Payer: 59 | Admitting: Orthopedic Surgery

## 2022-06-11 DIAGNOSIS — S51802A Unspecified open wound of left forearm, initial encounter: Secondary | ICD-10-CM

## 2022-06-11 DIAGNOSIS — S56522A Laceration of other extensor muscle, fascia and tendon at forearm level, left arm, initial encounter: Secondary | ICD-10-CM

## 2022-06-11 NOTE — Progress Notes (Signed)
Post-Op Visit Note   Patient: Phillip Anderson           Date of Birth: [birth year]           MRN: [id number] Visit Date: 06/11/2022 PCP: Carlean Jews, NP   Assessment & Plan:  Chief Complaint:  Chief Complaint  Patient presents with   Left Forearm - Routine Post Op   Visit Diagnoses:  1. Extensor tendon laceration of left forearm with open wound, initial encounter     Plan:  Patient is 2.5 weeks s/p I&D of open left proximal dorsal forearm wound with evacuation of large amount of hematoma.  The wound has healed except for the distal most aspect which is healing by secondary intention.  He has full ROM of the elbow, wrist, and fingers.  His swelling is much improved.  He can follow up again with me as needed.   Follow-Up Instructions: No follow-ups on file.   Orders:  No orders of the defined types were placed in this encounter.  No orders of the defined types were placed in this encounter.   Imaging: No results found.  PMFS History: Patient Active Problem List   Diagnosis Date Noted   Extensor tendon laceration of left forearm with open wound    Laceration of right forearm 05/11/2022   Chest pain 02/26/2022   Abnormal ECG 02/26/2022   Essential hypertension 02/26/2022   Pulmonary nodule 02/26/2022   Polysubstance dependence including opioid type drug, episodic abuse (HCC) 03/25/2012   TENDINITIS, SHOULDER, LEFT 09/14/2008   TOBACCO ABUSE 12/19/2007   CHEST WALL PAIN, ACUTE 12/11/2007   BACK PAIN 09/30/2007   Past Medical History:  Diagnosis Date   Allergic    BACK PAIN 09/30/2007   Qualifier: Diagnosis of  By: Tawanna Cooler MD, Tinnie Gens A    Chest pain    CHEST WALL PAIN, ACUTE 12/11/2007   Qualifier: Diagnosis of  By: Tawanna Cooler MD, Tinnie Gens A    Depression    Esophagitis    Essential hypertension 02/26/2022   Folliculitis    Lumbar strain    No pertinent past medical history    Pain in anterior left upper extremity    Polysubstance dependence including opioid  type drug, episodic abuse (HCC)    Pulmonary nodule 02/26/2022   TENDINITIS, SHOULDER, LEFT 09/14/2008   Qualifier: Diagnosis of  By: Tawanna Cooler MD, Eugenio Hoes    Testicular pain    TOBACCO ABUSE 12/19/2007   Qualifier: Diagnosis of  By: Tawanna Cooler MD, Eugenio Hoes     No family history on file.  Past Surgical History:  Procedure Laterality Date   ARTERY AND TENDON REPAIR Left 07/21/2016   Procedure: LEFT ARM EXPLORATION;  Surgeon: Knute Neu, MD;  Location: [facility] OR;  Service: Orthopedics;  Laterality: Left;   I & D EXTREMITY Right 05/23/2022   Procedure: IRRIGATION AND DEBRIDEMENT RIGHT ARM;  Surgeon: Marlyne Beards, MD;  Location: [facility];  Service: Orthopedics;  Laterality: Right;   Social History   Occupational History   Not on file  Tobacco Use   Smoking status: Every Day    Packs/day: 1.00    Types: Cigarettes    Start date: 1993   Smokeless tobacco: Never  Vaping Use   Vaping Use: Never used  Substance and Sexual Activity   Alcohol use: Not Currently    Alcohol/week: 2.0 standard drinks of alcohol    Types: 2 Cans of beer per week   Drug use: Yes    Types:  Cocaine, Marijuana    Comment: last cocaine "last week", last marijuana "a week or 2"   Sexual activity: Yes    Partners: Female    Birth control/protection: None

## 2022-10-09 ENCOUNTER — Ambulatory Visit: Payer: No Typology Code available for payment source | Admitting: Nurse Practitioner

## 2022-10-18 ENCOUNTER — Ambulatory Visit: Payer: No Typology Code available for payment source | Admitting: Nurse Practitioner

## 2022-11-05 ENCOUNTER — Other Ambulatory Visit: Payer: Self-pay | Admitting: Nurse Practitioner

## 2022-11-05 DIAGNOSIS — I10 Essential (primary) hypertension: Secondary | ICD-10-CM

## 2022-12-19 ENCOUNTER — Other Ambulatory Visit: Payer: Self-pay | Admitting: Nurse Practitioner

## 2022-12-19 DIAGNOSIS — I10 Essential (primary) hypertension: Secondary | ICD-10-CM

## 2023-01-10 ENCOUNTER — Ambulatory Visit
Admission: EM | Admit: 2023-01-10 | Discharge: 2023-01-10 | Disposition: A | Discharge status: Home / Self Care | Payer: No Typology Code available for payment source

## 2023-01-10 DIAGNOSIS — R1032 Left lower quadrant pain: Secondary | ICD-10-CM

## 2023-01-10 DIAGNOSIS — K625 Hemorrhage of anus and rectum: Secondary | ICD-10-CM

## 2023-01-10 NOTE — ED Notes (Signed)
Patient is being discharged from the Urgent Care and sent to the Emergency Department via private vehicle . Per Nelda Severe PA, patient is in need of higher level of care due to abdominal pain and GI bleed. Patient is aware and verbalizes understanding of plan of care.  Vitals:   01/10/23 1159  BP: (!) 158/116  Pulse: 81  Resp: 16  Temp: 98.3 F (36.8 C)  SpO2: 98%

## 2023-01-10 NOTE — ED Provider Notes (Signed)
[facility]    CSN: [id number] Arrival date & time: 01/10/23  1145      History   Chief Complaint Chief Complaint  Patient presents with   Abdominal Pain    HPI Phillip Anderson is a 42 y.o. male.   42 year old male presents with abdominal pain and rectal bleeding.  Patient indicates for the past week he has been having persistent and progressive left lower quadrant pain associated with nausea and intermittent vomiting.  He relates that he also has been having rectal bleeding, bright red blood, "turns toilet bowl red".  Patient indicates his symptoms have been occurring on a regular basis for the past week and he has been having multiple bouts of bloody bowel movements.  Patient is without fever, chills, relates that this happened 1 time before and he was advised that he had an ulcer, but he has not been consuming any alcohol recently to cause his symptoms that started a week ago.  Patient indicates on a scale of 1-10 pain is 9/10.   Abdominal Pain   Past Medical History:  Diagnosis Date   Allergic    BACK PAIN 09/30/2007   Qualifier: Diagnosis of  By: Sherren Mocha MD, Dellis Filbert A    Chest pain    CHEST WALL PAIN, ACUTE 12/11/2007   Qualifier: Diagnosis of  By: Sherren Mocha MD, Jory Ee    Depression    Esophagitis    Essential hypertension [id number]   Folliculitis    Lumbar strain    No pertinent past medical history    Pain in anterior left upper extremity    Polysubstance dependence including opioid type drug, episodic abuse ([place])    Pulmonary nodule 02/26/2022   TENDINITIS, SHOULDER, LEFT 09/14/2008   Qualifier: Diagnosis of  By: Sherren Mocha MD, Jory Ee    Testicular pain    TOBACCO ABUSE 12/19/2007   Qualifier: Diagnosis of  By: Sherren Mocha MD, Jory Ee     Patient Active Problem List   Diagnosis Date Noted   Extensor tendon laceration of left forearm with open wound    Laceration of right forearm 05/11/2022   Chest pain 02/26/2022   Abnormal ECG 02/26/2022   Essential  hypertension 02/26/2022   Pulmonary nodule 02/26/2022   Polysubstance dependence including opioid type drug, episodic abuse ([place]) 03/25/2012   TENDINITIS, SHOULDER, LEFT 09/14/2008   TOBACCO ABUSE 12/19/2007   CHEST WALL PAIN, ACUTE 12/11/2007   BACK PAIN 09/30/2007    Past Surgical History:  Procedure Laterality Date   ARTERY AND TENDON REPAIR Left 07/21/2016   Procedure: LEFT ARM EXPLORATION;  Surgeon: Dayna Barker, MD;  Location: [facility];  Service: Orthopedics;  Laterality: Left;   I & D EXTREMITY Right 05/23/2022   Procedure: IRRIGATION AND DEBRIDEMENT RIGHT ARM;  Surgeon: Sherilyn Cooter, MD;  Location: [facility];  Service: Orthopedics;  Laterality: Right;       Home Medications    Prior to Admission medications   Medication Sig Start Date End Date Taking? Authorizing Provider  ibuprofen (ADVIL) 800 MG tablet Take 1 tablet (800 mg total) by mouth every 8 (eight) hours as needed for up to 21 doses. 05/12/22   Wyvonnia Dusky, MD  losartan (COZAAR) 25 MG tablet Take 1 tablet (25 mg total) by mouth daily. 02/26/22   Ronnell Freshwater, NP  senna-docusate (SENOKOT-S) 8.6-50 MG tablet Take 1 tablet by mouth at bedtime as needed for mild constipation. 05/09/22   Long, Wonda Olds, MD    Family History  History reviewed. No pertinent family history.  Social History Social History   Tobacco Use   Smoking status: Every Day    Packs/day: 1    Types: Cigarettes    Start date: 1993   Smokeless tobacco: Never  Vaping Use   Vaping Use: Never used  Substance Use Topics   Alcohol use: Not Currently    Alcohol/week: 2.0 standard drinks of alcohol    Types: 2 Cans of beer per week   Drug use: Yes    Types: Cocaine, Marijuana    Comment: last cocaine "last week", last marijuana "a week or 2"     Allergies   Bee venom and Hornet venom   Review of Systems Review of Systems  Gastrointestinal:  Positive for abdominal pain (left lower quadrant).     Physical  Exam Triage Vital Signs ED Triage Vitals [01/10/23 1159]  Enc Vitals Group     BP (!) 158/116     Pulse Rate 81     Resp 16     Temp 98.3 F (36.8 C)     Temp Source Oral     SpO2 98 %     Weight      Height      Head Circumference      Peak Flow      Pain Score 9     Pain Loc      Pain Edu?      Excl. in [place]?    No data found.  Updated Vital Signs BP (!) 158/116 (BP Location: Left Arm)   Pulse 81   Temp 98.3 F (36.8 C) (Oral)   Resp 16   SpO2 98%   Visual Acuity Right Eye Distance:   Left Eye Distance:   Bilateral Distance:    Right Eye Near:   Left Eye Near:    Bilateral Near:     Physical Exam Constitutional:      Appearance: He is well-developed.  Cardiovascular:     Rate and Rhythm: Normal rate and regular rhythm.     Heart sounds: Normal heart sounds.  Pulmonary:     Effort: Pulmonary effort is normal.     Breath sounds: Normal breath sounds and air entry. No wheezing, rhonchi or rales.  Abdominal:     General: Abdomen is flat. Bowel sounds are normal.     Palpations: Abdomen is soft.     Tenderness: There is abdominal tenderness in the left lower quadrant. There is guarding.  Lymphadenopathy:     Cervical: No cervical adenopathy.  Neurological:     Mental Status: He is alert.      UC Treatments / Results  Labs (all labs ordered are listed, but only abnormal results are displayed) Labs Reviewed - No data to display  EKG   Radiology No results found.  Procedures Procedures (including critical care time)  Medications Ordered in UC Medications - No data to display  Initial Impression / Assessment and Plan / UC Course  I have reviewed the triage vital signs and the nursing notes.  Pertinent labs & imaging results that were available during my care of the patient were reviewed by me and considered in my medical decision making (see chart for details).    Plan: Diagnosis should be treated with the following: 1.  Left lower quadrant  pain: A.  Patient advised to report to the emergency room for higher level of care. 2.  Rectal bleeding: A.  Patient advised to report to the emergency  room for higher level of care. 3.  Advised to follow-up PCP or return to urgent care as needed. Final Clinical Impressions(s) / UC Diagnoses   Final diagnoses:  Left lower quadrant abdominal pain  Rectal bleeding     Discharge Instructions      Report to the emergency room for higher level of care due to abdominal pain and rectal bleeding.    ED Prescriptions   None    PDMP not reviewed this encounter.   Mahmood, [place], PA-C 01/10/23 1215

## 2023-01-10 NOTE — Discharge Instructions (Addendum)
Report to the emergency room for higher level of care due to abdominal pain and rectal bleeding.

## 2023-01-10 NOTE — ED Triage Notes (Signed)
Pt states upper abdominal pain with nausea and blood in his stool for the past week.  Has been taking Ibuprofen at home for the pain.

## 2023-01-24 ENCOUNTER — Ambulatory Visit: Payer: No Typology Code available for payment source | Admitting: Nurse Practitioner

## 2023-01-24 NOTE — Progress Notes (Deleted)
Established patient visit   Patient: Phillip Anderson   DOB: 11-25-1980   42 y.o. Male  MRN: [id number] Visit Date: 01/24/2023   No chief complaint on file.  Subjective    HPI  Follow up  -hypertension     Medications: Outpatient Medications Prior to Visit  Medication Sig   ibuprofen (ADVIL) 800 MG tablet Take 1 tablet (800 mg total) by mouth every 8 (eight) hours as needed for up to 21 doses.   losartan (COZAAR) 25 MG tablet Take 1 tablet (25 mg total) by mouth daily.   senna-docusate (SENOKOT-S) 8.6-50 MG tablet Take 1 tablet by mouth at bedtime as needed for mild constipation.   No facility-administered medications prior to visit.    Review of Systems  {Labs (Optional):23779}   Objective    There were no vitals filed for this visit. There is no height or weight on file to calculate BMI.  BP Readings from Last 3 Encounters:  01/10/23 (Abnormal) 158/116  05/23/22 (Abnormal) 163/94  05/22/22 (Abnormal) 137/107    Wt Readings from Last 3 Encounters:  05/23/22 233 lb 7.5 oz (105.9 kg)  05/22/22 235 lb (106.6 kg)  05/09/22 235 lb (106.6 kg)    Physical Exam  ***  No results found for any visits on 01/24/23.  Assessment & Plan    There are no diagnoses linked to this encounter.   No follow-ups on file.         Ronnell Freshwater, NP  [facility] [phone] (phone) [phone] (fax)  [place]

## 2023-01-26 ENCOUNTER — Other Ambulatory Visit: Payer: Self-pay

## 2023-01-26 ENCOUNTER — Emergency Department
Admission: EM | Admit: 2023-01-26 | Discharge: 2023-01-26 | Disposition: A | Discharge status: Other Acute Inpatient Hospital | Payer: No Typology Code available for payment source | Attending: Emergency Medicine | Admitting: Emergency Medicine

## 2023-01-26 ENCOUNTER — Encounter: Payer: Self-pay | Admitting: Emergency Medicine

## 2023-01-26 DIAGNOSIS — Z1152 Encounter for screening for COVID-19: Secondary | ICD-10-CM | POA: Diagnosis not present

## 2023-01-26 DIAGNOSIS — T3110 Burns involving 10-19% of body surface with 0% to 9% third degree burns: Secondary | ICD-10-CM | POA: Diagnosis not present

## 2023-01-26 DIAGNOSIS — Y929 Unspecified place or not applicable: Secondary | ICD-10-CM | POA: Insufficient documentation

## 2023-01-26 DIAGNOSIS — T24331A Burn of third degree of right lower leg, initial encounter: Secondary | ICD-10-CM | POA: Diagnosis not present

## 2023-01-26 DIAGNOSIS — T24332A Burn of third degree of left lower leg, initial encounter: Secondary | ICD-10-CM | POA: Diagnosis not present

## 2023-01-26 DIAGNOSIS — X088XXA Exposure to other specified smoke, fire and flames, initial encounter: Secondary | ICD-10-CM | POA: Insufficient documentation

## 2023-01-26 LAB — COMPREHENSIVE METABOLIC PANEL
ALT: 11 U/L (ref 0–44)
AST: 19 U/L (ref 15–41)
Albumin: 4.4 g/dL (ref 3.5–5.0)
Alkaline Phosphatase: 61 U/L (ref 38–126)
Anion gap: 9 (ref 5–15)
BUN: 12 mg/dL (ref 6–20)
CO2: 28 mmol/L (ref 22–32)
Calcium: 9 mg/dL (ref 8.9–10.3)
Chloride: 103 mmol/L (ref 98–111)
Creatinine, Ser: 1.19 mg/dL (ref 0.61–1.24)
GFR, Estimated: >60 mL/min (ref >60)
Glucose, Bld: 152 mg/dL — ABNORMAL HIGH (ref 70–99)
Potassium: 4.3 mmol/L (ref 3.5–5.1)
Sodium: 140 mmol/L (ref 135–145)
Total Bilirubin: 1.2 mg/dL (ref 0.3–1.2)
Total Protein: 7.8 g/dL (ref 6.5–8.1)

## 2023-01-26 LAB — CBC
HCT: 47.8 % (ref 39.0–52.0)
Hemoglobin: 15.5 g/dL (ref 13.0–17.0)
MCH: 31 pg (ref 26.0–34.0)
MCHC: 32.4 g/dL (ref 30.0–36.0)
MCV: 95.6 fL (ref 80.0–100.0)
Platelets: 211 10*3/uL (ref 150–400)
RBC: 5 MIL/uL (ref 4.22–5.81)
RDW: 12.2 % (ref 11.5–15.5)
WBC: 8.4 10*3/uL (ref 4.0–10.5)
nRBC: 0 % (ref 0.0–0.2)

## 2023-01-26 LAB — RESP PANEL BY RT-PCR (RSV, FLU A&B, COVID)  RVPGX2
Influenza A by PCR: NEGATIVE
Influenza B by PCR: NEGATIVE
Resp Syncytial Virus by PCR: NEGATIVE
SARS Coronavirus 2 by RT PCR: NEGATIVE

## 2023-01-26 MED ORDER — HYDROMORPHONE HCL 1 MG/ML IJ SOLN
1.0000 mg | Freq: Once | INTRAMUSCULAR | Status: AC
  Administered 2023-01-26: 1 mg via INTRAVENOUS
  Filled 2023-01-26: qty 1

## 2023-01-26 MED ORDER — SODIUM CHLORIDE 0.9 % IV BOLUS
1000.0000 mL | Freq: Once | INTRAVENOUS | Status: AC
  Administered 2023-01-26: 1000 mL via INTRAVENOUS

## 2023-01-26 MED ORDER — HYDROMORPHONE HCL 1 MG/ML IJ SOLN
0.5000 mg | Freq: Once | INTRAMUSCULAR | Status: AC
  Administered 2023-01-26: 0.5 mg via INTRAVENOUS
  Filled 2023-01-26: qty 0.5

## 2023-01-26 NOTE — ED Notes (Signed)
Pt RLE dressing changed. New nonadherent padding placed and wrapped with gauze.
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
## 2023-01-26 NOTE — ED Notes (Signed)
Pt able to wiggle toes and sensation intact bilateral lower extremities at this time
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
## 2023-01-26 NOTE — ED Notes (Signed)
Dry non-adherent dressing applied and wrapped in kerlix to bilateral lower legs

## 2023-02-16 ENCOUNTER — Emergency Department (HOSPITAL_COMMUNITY)
Admission: EM | Admit: 2023-02-16 | Discharge: 2023-02-16 | Disposition: A | Discharge status: Home / Self Care | Payer: No Typology Code available for payment source | Attending: Emergency Medicine | Admitting: Emergency Medicine

## 2023-02-16 ENCOUNTER — Encounter (HOSPITAL_COMMUNITY): Payer: Self-pay

## 2023-02-16 DIAGNOSIS — Z79899 Other long term (current) drug therapy: Secondary | ICD-10-CM | POA: Diagnosis not present

## 2023-02-16 DIAGNOSIS — I10 Essential (primary) hypertension: Secondary | ICD-10-CM | POA: Diagnosis not present

## 2023-02-16 DIAGNOSIS — M79604 Pain in right leg: Secondary | ICD-10-CM

## 2023-02-16 DIAGNOSIS — D72829 Elevated white blood cell count, unspecified: Secondary | ICD-10-CM | POA: Insufficient documentation

## 2023-02-16 DIAGNOSIS — M79661 Pain in right lower leg: Secondary | ICD-10-CM | POA: Diagnosis present

## 2023-02-16 DIAGNOSIS — M79662 Pain in left lower leg: Secondary | ICD-10-CM | POA: Insufficient documentation

## 2023-02-16 LAB — CBC WITH DIFFERENTIAL/PLATELET
Abs Immature Granulocytes: 0.02 10*3/uL (ref 0.00–0.07)
Basophils Absolute: 0 10*3/uL (ref 0.0–0.1)
Basophils Relative: 0 %
Eosinophils Absolute: 0 10*3/uL (ref 0.0–0.5)
Eosinophils Relative: 0 %
HCT: 41.6 % (ref 39.0–52.0)
Hemoglobin: 13.7 g/dL (ref 13.0–17.0)
Immature Granulocytes: 0 %
Lymphocytes Relative: 15 %
Lymphs Abs: 1.8 10*3/uL (ref 0.7–4.0)
MCH: 30.2 pg (ref 26.0–34.0)
MCHC: 32.9 g/dL (ref 30.0–36.0)
MCV: 91.8 fL (ref 80.0–100.0)
Monocytes Absolute: 0.5 10*3/uL (ref 0.1–1.0)
Monocytes Relative: 4 %
Neutro Abs: 9.5 10*3/uL — ABNORMAL HIGH (ref 1.7–7.7)
Neutrophils Relative %: 81 %
Platelets: 262 10*3/uL (ref 150–400)
RBC: 4.53 MIL/uL (ref 4.22–5.81)
RDW: 11.9 % (ref 11.5–15.5)
WBC: 11.8 10*3/uL — ABNORMAL HIGH (ref 4.0–10.5)
nRBC: 0 % (ref 0.0–0.2)

## 2023-02-16 LAB — BASIC METABOLIC PANEL
Anion gap: 8 (ref 5–15)
BUN: 9 mg/dL (ref 6–20)
CO2: 27 mmol/L (ref 22–32)
Calcium: 8.7 mg/dL — ABNORMAL LOW (ref 8.9–10.3)
Chloride: 103 mmol/L (ref 98–111)
Creatinine, Ser: 0.97 mg/dL (ref 0.61–1.24)
GFR, Estimated: >60 mL/min (ref >60)
Glucose, Bld: 83 mg/dL (ref 70–99)
Potassium: 4.4 mmol/L (ref 3.5–5.1)
Sodium: 138 mmol/L (ref 135–145)

## 2023-02-16 LAB — MAGNESIUM: Magnesium: 1.9 mg/dL (ref 1.7–2.4)

## 2023-02-16 MED ORDER — HYDROMORPHONE HCL 8 MG PO TABS: 8.0000 mg | ORAL_TABLET | Freq: Four times a day (QID) | ORAL | 0 refills | Status: AC | PRN

## 2023-02-16 MED ORDER — HYDROMORPHONE HCL 1 MG/ML IJ SOLN
0.5000 mg | Freq: Once | INTRAMUSCULAR | Status: AC | PRN
  Administered 2023-02-16: 0.5 mg via INTRAVENOUS
  Filled 2023-02-16: qty 1

## 2023-02-16 MED ORDER — ACETAMINOPHEN 500 MG PO TABS
1000.0000 mg | ORAL_TABLET | Freq: Once | ORAL | Status: AC
  Administered 2023-02-16: 1000 mg via ORAL
  Filled 2023-02-16: qty 2

## 2023-02-16 MED ORDER — NALOXONE HCL 0.4 MG/ML IJ SOLN: 0.2000 mg | INTRAMUSCULAR | Status: DC | PRN

## 2023-02-16 MED ORDER — FENTANYL CITRATE PF 50 MCG/ML IJ SOSY
50.0000 ug | PREFILLED_SYRINGE | Freq: Once | INTRAMUSCULAR | Status: AC
  Administered 2023-02-16: 50 ug via INTRAVENOUS
  Filled 2023-02-16: qty 1

## 2023-02-16 MED ORDER — ONDANSETRON HCL 4 MG/2ML IJ SOLN: 4.0000 mg | Freq: Four times a day (QID) | INTRAMUSCULAR | Status: DC | PRN

## 2023-02-16 MED ORDER — HYDROMORPHONE HCL 1 MG/ML IJ SOLN
1.0000 mg | Freq: Once | INTRAMUSCULAR | Status: AC
  Administered 2023-02-16: 1 mg via INTRAVENOUS
  Filled 2023-02-16: qty 1

## 2023-02-16 MED ORDER — HYDROMORPHONE HCL 1 MG/ML IJ SOLN
0.5000 mg | Freq: Once | INTRAMUSCULAR | Status: AC
  Administered 2023-02-16: 0.5 mg via INTRAVENOUS
  Filled 2023-02-16: qty 1

## 2023-02-16 MED ORDER — NICOTINE 7 MG/24HR TD PT24
7.0000 mg | MEDICATED_PATCH | Freq: Every day | TRANSDERMAL | Status: DC
  Administered 2023-02-16: 7 mg via TRANSDERMAL
  Filled 2023-02-16 (×2): qty 1

## 2023-02-16 MED ORDER — KETOROLAC TROMETHAMINE 15 MG/ML IJ SOLN
15.0000 mg | Freq: Once | INTRAMUSCULAR | Status: AC
  Administered 2023-02-16: 15 mg via INTRAVENOUS
  Filled 2023-02-16: qty 1
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
## 2023-02-16 NOTE — ED Notes (Signed)
[phone] mother would like a update
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
## 2023-02-17 NOTE — ED Notes (Signed)
Pt provided with AVS.  Education complete; all questions answered.  Pt leaving ED in stable condition at this time, ambulatory with all belongings. 
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
## 2023-02-18 DIAGNOSIS — T3110 Burns involving 10-19% of body surface with 0% to 9% third degree burns: Secondary | ICD-10-CM | POA: Diagnosis not present

## 2023-02-18 DIAGNOSIS — G8921 Chronic pain due to trauma: Secondary | ICD-10-CM | POA: Diagnosis not present

## 2023-05-08 DIAGNOSIS — Z79899 Other long term (current) drug therapy: Secondary | ICD-10-CM | POA: Diagnosis not present

## 2023-05-08 DIAGNOSIS — G8921 Chronic pain due to trauma: Secondary | ICD-10-CM | POA: Diagnosis not present

## 2023-05-08 DIAGNOSIS — I10 Essential (primary) hypertension: Secondary | ICD-10-CM | POA: Diagnosis not present

## 2023-05-08 DIAGNOSIS — Z6835 Body mass index (BMI) 35.0-35.9, adult: Secondary | ICD-10-CM | POA: Diagnosis not present

## 2023-05-13 DIAGNOSIS — Z79899 Other long term (current) drug therapy: Secondary | ICD-10-CM | POA: Diagnosis not present

## 2023-05-22 ENCOUNTER — Emergency Department (HOSPITAL_COMMUNITY): Payer: Medicaid Other

## 2023-05-22 ENCOUNTER — Encounter (HOSPITAL_COMMUNITY): Payer: Self-pay

## 2023-05-22 ENCOUNTER — Emergency Department (HOSPITAL_COMMUNITY)
Admission: EM | Admit: 2023-05-22 | Discharge: 2023-05-23 | Disposition: A | Discharge status: Home / Self Care | Payer: Medicaid Other | Attending: Emergency Medicine | Admitting: Emergency Medicine

## 2023-05-22 DIAGNOSIS — R079 Chest pain, unspecified: Secondary | ICD-10-CM | POA: Diagnosis not present

## 2023-05-22 DIAGNOSIS — F17200 Nicotine dependence, unspecified, uncomplicated: Secondary | ICD-10-CM | POA: Insufficient documentation

## 2023-05-22 DIAGNOSIS — Z79899 Other long term (current) drug therapy: Secondary | ICD-10-CM | POA: Diagnosis not present

## 2023-05-22 DIAGNOSIS — R002 Palpitations: Secondary | ICD-10-CM | POA: Insufficient documentation

## 2023-05-22 DIAGNOSIS — I10 Essential (primary) hypertension: Secondary | ICD-10-CM | POA: Diagnosis not present

## 2023-05-22 DIAGNOSIS — R0789 Other chest pain: Secondary | ICD-10-CM | POA: Diagnosis not present

## 2023-05-22 LAB — BASIC METABOLIC PANEL
Anion gap: 13 (ref 5–15)
BUN: 11 mg/dL (ref 6–20)
CO2: 22 mmol/L (ref 22–32)
Calcium: 9.1 mg/dL (ref 8.9–10.3)
Chloride: 99 mmol/L (ref 98–111)
Creatinine, Ser: 0.66 mg/dL (ref 0.61–1.24)
GFR, Estimated: >60 mL/min (ref >60)
Glucose, Bld: 93 mg/dL (ref 70–99)
Potassium: 4.1 mmol/L (ref 3.5–5.1)
Sodium: 134 mmol/L — ABNORMAL LOW (ref 135–145)

## 2023-05-22 LAB — CBC
HCT: 47 % (ref 39.0–52.0)
Hemoglobin: 15.6 g/dL (ref 13.0–17.0)
MCH: 30.6 pg (ref 26.0–34.0)
MCHC: 33.2 g/dL (ref 30.0–36.0)
MCV: 92.3 fL (ref 80.0–100.0)
Platelets: 171 10*3/uL (ref 150–400)
RBC: 5.09 MIL/uL (ref 4.22–5.81)
RDW: 12 % (ref 11.5–15.5)
WBC: 6.4 10*3/uL (ref 4.0–10.5)
nRBC: 0 % (ref 0.0–0.2)

## 2023-05-22 LAB — TROPONIN I (HIGH SENSITIVITY): Troponin I (High Sensitivity): 5 ng/L (ref <18)
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
## 2023-05-23 LAB — TROPONIN I (HIGH SENSITIVITY): Troponin I (High Sensitivity): 5 ng/L (ref <18)

## 2023-05-23 NOTE — ED Provider Notes (Signed)
[facility] EMERGENCY DEPARTMENT AT [facility] Provider Note   CSN: [id number] Arrival date & time: 05/22/23  2108     History  Chief Complaint  Patient presents with   Chest Pain    Phillip Anderson is a 42 y.o. male.  Patient presents to the emergency room via EMS complaining of chest pain which is been ongoing for 2 years, worsened today after patient got angry.  Patient endorses intermittent palpitations.  He denies pain which is a pressure.  He states currently the pain is mild in nature but he states it felt worse earlier when he was angry.  He states it typically gets worse when he becomes angry.  He denies shortness of breath, radiation of symptoms, nausea, vomiting, abdominal pain.  Past medical history significant for tobacco abuse, chest wall pain, hypertension  HPI     Home Medications Prior to Admission medications   Medication Sig Start Date End Date Taking? Authorizing Provider  ibuprofen (ADVIL) 800 MG tablet Take 1 tablet (800 mg total) by mouth every 8 (eight) hours as needed for up to 21 doses. 05/12/22   Terald Sleeper, MD  losartan (COZAAR) 25 MG tablet Take 1 tablet (25 mg total) by mouth daily. 02/26/22   Carlean Jews, NP  senna-docusate (SENOKOT-S) 8.6-50 MG tablet Take 1 tablet by mouth at bedtime as needed for mild constipation. 05/09/22   Long, Arlyss Repress, MD      Allergies    Bee venom and Hornet venom    Review of Systems   Review of Systems  Physical Exam Updated Vital Signs BP (!) 132/93   Pulse 68   Temp 98.4 F (36.9 C) (Oral)   Resp 16   Ht 5\' 10"  (1.778 m)   Wt 108.9 kg   SpO2 100%   BMI 34.44 kg/m  Physical Exam Vitals and nursing note reviewed.  Constitutional:      General: He is not in acute distress.    Appearance: He is well-developed.  HENT:     Head: Normocephalic and atraumatic.  Eyes:     Conjunctiva/sclera: Conjunctivae normal.  Cardiovascular:     Rate and Rhythm: Normal rate and regular rhythm.   Pulmonary:     Effort: Pulmonary effort is normal. No respiratory distress.     Breath sounds: Normal breath sounds.  Chest:     Chest wall: No tenderness.  Abdominal:     Palpations: Abdomen is soft.     Tenderness: There is no abdominal tenderness.  Musculoskeletal:        General: No swelling.     Cervical back: Neck supple.  Skin:    General: Skin is warm and dry.     Capillary Refill: Capillary refill takes less than 2 seconds.  Neurological:     Mental Status: He is alert.  Psychiatric:        Mood and Affect: Mood normal.     ED Results / Procedures / Treatments   Labs (all labs ordered are listed, but only abnormal results are displayed) Labs Reviewed  BASIC METABOLIC PANEL - Abnormal; Notable for the following components:      Result Value   Sodium 134 (*)    All other components within normal limits  CBC  TROPONIN I (HIGH SENSITIVITY)  TROPONIN I (HIGH SENSITIVITY)    EKG EKG Interpretation Date/Time:  Wednesday May 22 2023 21:16:41 EDT Ventricular Rate:  73 PR Interval:  183 QRS Duration:  84 QT Interval:  373 QTC Calculation: 411 R Axis:   26  Text Interpretation: Sinus rhythm Abnormal R-wave progression, early transition Borderline ST elevation, lateral leads Confirmed by Drema Pry [phone]) on 05/22/2023 11:34:33 PM  Radiology DG Chest 2 View  Result Date: 05/22/2023 CLINICAL DATA:  cp EXAM: CHEST - 2 VIEW COMPARISON:  CXR 11/29/19 FINDINGS: No pleural effusion. No pneumothorax. Normal cardiac and mediastinal contours. No focal airspace opacity. No radiographically apparent displaced rib fractures. Visualized upper abdomen is unremarkable. Vertebral body heights are maintained. IMPRESSION: No focal airspace opacity Electronically Signed   By: Lorenza Cambridge M.D.   On: 05/22/2023 21:37    Procedures Procedures    Medications Ordered in ED Medications - No data to display  ED Course/ Medical Decision Making/ A&P             HEART Score: 1                 Medical Decision Making Amount and/or Complexity of Data Reviewed Labs: ordered. Radiology: ordered.   This patient presents to the ED for concern of chest pain, this involves an extensive number of treatment options, and is a complaint that carries with it a high risk of complications and morbidity.  The differential diagnosis includes ACS, PE, pneumonia, dissection, musculoskeletal pain, anxiety, others   Co morbidities that complicate the patient evaluation  Tobacco use, hypertension   Additional history obtained:  Additional history obtained from EMS   Lab Tests:  I Ordered, and personally interpreted labs.  The pertinent results include: Initial and repeat troponin of 5, unremarkable BMP, CBC   Imaging Studies ordered:  I ordered imaging studies including chest x-ray I independently visualized and interpreted imaging which showed no focal airspace opacity I agree with the radiologist interpretation   Cardiac Monitoring: / EKG:  The patient was maintained on a cardiac monitor.  I personally viewed and interpreted the cardiac monitored which showed an underlying rhythm of: Sinus rhythm   Social Determinants of Health:  Patient has Medicaid for his primary insurance type   Test / Admission - Considered:  Patient with negative troponins x 2, low heart score, nonischemic EKG.  Very low suspicion of ACS.  No shortness of breath or sharp pain, no tachycardia to suggest pulmonary embolism.  No clinical signs of dissection.  No pneumonia on chest x-ray.  Patient endorses worsening pain with anger.  Currently he states his pain is mild in nature.  This may be due to underlying anxiety.  I see no indication for admission for this time.  Plan to discharge home with cardiology follow-up.  Referral placed.  Patient given return precautions and voices understanding.         Final Clinical Impression(s) / ED Diagnoses Final diagnoses:  Chest pain, unspecified  type    Rx / DC Orders ED Discharge Orders          Ordered    Ambulatory referral to Cardiology       Comments: If you have not heard from the Cardiology office within the next 72 hours please call [phone].   05/23/23 0236              Darrick Grinder, PA-C 05/23/23 0237    Nira Conn, MD 05/24/23 [phone]

## 2023-05-23 NOTE — Discharge Instructions (Signed)
You were evaluated tonight for chest pain.  Your workup was reassuring with no signs of acute coronary syndrome.  Your x-ray was unremarkable.  I have placed a referral to cardiology.  They should contact you to schedule an appointment for further evaluation and management.  If you develop a sudden worsening of chest pain, shortness of breath, or other life-threatening symptoms please return to the emergency department.

## 2023-05-31 ENCOUNTER — Ambulatory Visit (HOSPITAL_COMMUNITY)
Admission: EM | Admit: 2023-05-31 | Discharge: 2023-05-31 | Disposition: A | Discharge status: Home / Self Care | Payer: Medicaid Other | Attending: Psychiatry | Admitting: Psychiatry

## 2023-05-31 DIAGNOSIS — R451 Restlessness and agitation: Secondary | ICD-10-CM

## 2023-05-31 DIAGNOSIS — Z125 Encounter for screening for malignant neoplasm of prostate: Secondary | ICD-10-CM | POA: Diagnosis not present

## 2023-05-31 DIAGNOSIS — Z79899 Other long term (current) drug therapy: Secondary | ICD-10-CM | POA: Diagnosis not present

## 2023-05-31 DIAGNOSIS — Z0000 Encounter for general adult medical examination without abnormal findings: Secondary | ICD-10-CM | POA: Diagnosis not present

## 2023-05-31 DIAGNOSIS — Z20822 Contact with and (suspected) exposure to covid-19: Secondary | ICD-10-CM | POA: Diagnosis not present

## 2023-05-31 DIAGNOSIS — D539 Nutritional anemia, unspecified: Secondary | ICD-10-CM | POA: Diagnosis not present

## 2023-05-31 DIAGNOSIS — M79606 Pain in leg, unspecified: Secondary | ICD-10-CM | POA: Insufficient documentation

## 2023-05-31 DIAGNOSIS — E559 Vitamin D deficiency, unspecified: Secondary | ICD-10-CM | POA: Diagnosis not present

## 2023-05-31 DIAGNOSIS — R0602 Shortness of breath: Secondary | ICD-10-CM | POA: Diagnosis not present

## 2023-05-31 DIAGNOSIS — Z1159 Encounter for screening for other viral diseases: Secondary | ICD-10-CM | POA: Diagnosis not present

## 2023-05-31 DIAGNOSIS — M129 Arthropathy, unspecified: Secondary | ICD-10-CM | POA: Diagnosis not present

## 2023-05-31 DIAGNOSIS — Z6833 Body mass index (BMI) 33.0-33.9, adult: Secondary | ICD-10-CM | POA: Diagnosis not present

## 2023-05-31 DIAGNOSIS — Z1329 Encounter for screening for other suspected endocrine disorder: Secondary | ICD-10-CM | POA: Diagnosis not present
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
## 2023-05-31 NOTE — Discharge Instructions (Signed)

## 2023-07-03 DIAGNOSIS — Z6835 Body mass index (BMI) 35.0-35.9, adult: Secondary | ICD-10-CM | POA: Diagnosis not present

## 2023-07-03 DIAGNOSIS — E559 Vitamin D deficiency, unspecified: Secondary | ICD-10-CM | POA: Diagnosis not present

## 2023-07-03 DIAGNOSIS — G8921 Chronic pain due to trauma: Secondary | ICD-10-CM | POA: Diagnosis not present

## 2023-07-03 DIAGNOSIS — I10 Essential (primary) hypertension: Secondary | ICD-10-CM | POA: Diagnosis not present

## 2023-07-03 DIAGNOSIS — R946 Abnormal results of thyroid function studies: Secondary | ICD-10-CM | POA: Diagnosis not present

## 2023-07-03 DIAGNOSIS — Z79899 Other long term (current) drug therapy: Secondary | ICD-10-CM | POA: Diagnosis not present

## 2023-07-08 DIAGNOSIS — Z79899 Other long term (current) drug therapy: Secondary | ICD-10-CM | POA: Diagnosis not present

## 2023-07-28 DIAGNOSIS — I10 Essential (primary) hypertension: Secondary | ICD-10-CM | POA: Diagnosis not present

## 2023-07-28 DIAGNOSIS — Z79899 Other long term (current) drug therapy: Secondary | ICD-10-CM | POA: Diagnosis not present

## 2023-07-28 DIAGNOSIS — E6609 Other obesity due to excess calories: Secondary | ICD-10-CM | POA: Diagnosis not present

## 2023-07-28 DIAGNOSIS — G8921 Chronic pain due to trauma: Secondary | ICD-10-CM | POA: Diagnosis not present

## 2023-07-28 DIAGNOSIS — Z6835 Body mass index (BMI) 35.0-35.9, adult: Secondary | ICD-10-CM | POA: Diagnosis not present

## 2023-07-30 ENCOUNTER — Emergency Department (HOSPITAL_COMMUNITY)
Admission: EM | Admit: 2023-07-30 | Discharge: 2023-07-31 | Discharge status: Against Medical Advice | Payer: Medicaid Other | Attending: Emergency Medicine | Admitting: Emergency Medicine

## 2023-07-30 ENCOUNTER — Encounter (HOSPITAL_COMMUNITY): Payer: Self-pay | Admitting: Emergency Medicine

## 2023-07-30 DIAGNOSIS — M545 Low back pain, unspecified: Secondary | ICD-10-CM | POA: Diagnosis not present

## 2023-07-30 DIAGNOSIS — K5900 Constipation, unspecified: Secondary | ICD-10-CM | POA: Diagnosis present

## 2023-07-30 DIAGNOSIS — Z5321 Procedure and treatment not carried out due to patient leaving prior to being seen by health care provider: Secondary | ICD-10-CM | POA: Diagnosis not present

## 2023-07-30 NOTE — ED Notes (Signed)
Rn asked pt for urine sample and explained it may be helpful to check for infection or kidney stones that may be cause of back pain. Pt states he is here for constipation and "I should start with xray first". RN explained about protocol orders and that MD will have to order that. Explained not giving sample now may delay things if doctor wants later.
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
## 2023-07-31 ENCOUNTER — Emergency Department (HOSPITAL_COMMUNITY): Payer: Medicaid Other

## 2023-07-31 DIAGNOSIS — Z79899 Other long term (current) drug therapy: Secondary | ICD-10-CM | POA: Diagnosis not present

## 2023-07-31 NOTE — ED Notes (Signed)
No answer

## 2023-07-31 NOTE — ED Notes (Signed)
Pt in restroom when called from lobby for a room. When pt exited restroom, he stated that it is too late because he has already called his ride to come pick him up. Advised pt that we called him several times before and he didn't answer but we have a room available now for him to be seen by a provider. Pt stated he didn't want the room as he didn't want to make his ride come up here for nothing. Triage RN aware.

## 2023-09-11 DIAGNOSIS — Z6834 Body mass index (BMI) 34.0-34.9, adult: Secondary | ICD-10-CM | POA: Diagnosis not present

## 2023-09-11 DIAGNOSIS — T24202S Burn of second degree of unspecified site of left lower limb, except ankle and foot, sequela: Secondary | ICD-10-CM | POA: Diagnosis not present

## 2023-09-11 DIAGNOSIS — T24201S Burn of second degree of unspecified site of right lower limb, except ankle and foot, sequela: Secondary | ICD-10-CM | POA: Diagnosis not present

## 2023-09-11 DIAGNOSIS — R892 Abnormal level of other drugs, medicaments and biological substances in specimens from other organs, systems and tissues: Secondary | ICD-10-CM | POA: Diagnosis not present

## 2023-09-11 DIAGNOSIS — Z79899 Other long term (current) drug therapy: Secondary | ICD-10-CM | POA: Diagnosis not present

## 2023-10-18 DIAGNOSIS — K113 Abscess of salivary gland: Secondary | ICD-10-CM | POA: Diagnosis not present

## 2023-10-18 DIAGNOSIS — L0201 Cutaneous abscess of face: Secondary | ICD-10-CM | POA: Diagnosis not present

## 2023-10-18 DIAGNOSIS — R220 Localized swelling, mass and lump, head: Secondary | ICD-10-CM | POA: Diagnosis not present

## 2023-10-21 DIAGNOSIS — L02811 Cutaneous abscess of head [any part, except face]: Secondary | ICD-10-CM | POA: Diagnosis not present

## 2023-10-25 DIAGNOSIS — L02811 Cutaneous abscess of head [any part, except face]: Secondary | ICD-10-CM | POA: Diagnosis not present

## 2024-02-24 ENCOUNTER — Other Ambulatory Visit: Payer: Self-pay

## 2024-02-24 ENCOUNTER — Emergency Department (HOSPITAL_COMMUNITY)
Admission: EM | Admit: 2024-02-24 | Discharge: 2024-02-25 | Discharge status: Against Medical Advice | Attending: Emergency Medicine | Admitting: Emergency Medicine

## 2024-02-24 DIAGNOSIS — Z5321 Procedure and treatment not carried out due to patient leaving prior to being seen by health care provider: Secondary | ICD-10-CM | POA: Diagnosis not present

## 2024-02-24 DIAGNOSIS — R1031 Right lower quadrant pain: Secondary | ICD-10-CM | POA: Diagnosis not present

## 2024-02-24 DIAGNOSIS — R112 Nausea with vomiting, unspecified: Secondary | ICD-10-CM | POA: Diagnosis not present

## 2024-02-24 DIAGNOSIS — K5730 Diverticulosis of large intestine without perforation or abscess without bleeding: Secondary | ICD-10-CM | POA: Diagnosis not present

## 2024-02-24 DIAGNOSIS — R109 Unspecified abdominal pain: Secondary | ICD-10-CM | POA: Insufficient documentation

## 2024-02-24 NOTE — ED Notes (Signed)
 Pt sts he's unable to provide urine. Urine specimen cup provided for pt

## 2024-02-24 NOTE — ED Provider Triage Note (Signed)
 Emergency Medicine Provider Triage Evaluation Note  SYLER SALMONS , a 43 y.o. male  was evaluated in triage.  Pt complains of abdominal pain.  Review of Systems  Positive:  Negative:   Physical Exam  BP (!) 173/98 (BP Location: Right Arm)   Pulse 77   Temp 98.3 F (36.8 C) (Oral)   Resp 19   Ht 5\' 10"  (1.778 m)   Wt 108.9 kg   SpO2 100%   BMI 34.44 kg/m  Gen:   Awake, no distress   Resp:  Normal effort  MSK:   Moves extremities without difficulty  Other:    Medical Decision Making  Medically screening exam initiated at 11:24 PM.  Appropriate orders placed.  Teresita Fent was informed that the remainder of the evaluation will be completed by another provider, this initial triage assessment does not replace that evaluation, and the importance of remaining in the ED until their evaluation is complete.  Intermittent lower abdominal pain since yesterday. Also endorses nausea and vomiting. Denies diarrhea, dysuria, hematuria. Last BM yesterday.   [place], [place] 02/24/24 2326

## 2024-02-24 NOTE — ED Triage Notes (Signed)
 Pt reports recurrent lower abdominal pain that stared yesterday. Pain described as cramping. Associated with NV.  No urinary s/s.

## 2024-02-25 ENCOUNTER — Emergency Department (HOSPITAL_COMMUNITY)

## 2024-02-25 ENCOUNTER — Encounter (HOSPITAL_COMMUNITY): Payer: Self-pay

## 2024-02-25 DIAGNOSIS — R109 Unspecified abdominal pain: Secondary | ICD-10-CM | POA: Diagnosis not present

## 2024-02-25 DIAGNOSIS — K5730 Diverticulosis of large intestine without perforation or abscess without bleeding: Secondary | ICD-10-CM | POA: Diagnosis not present

## 2024-02-25 DIAGNOSIS — R112 Nausea with vomiting, unspecified: Secondary | ICD-10-CM | POA: Diagnosis not present

## 2024-02-25 LAB — CBC WITH DIFFERENTIAL/PLATELET
Abs Immature Granulocytes: 0.01 10*3/uL (ref 0.00–0.07)
Basophils Absolute: 0 10*3/uL (ref 0.0–0.1)
Basophils Relative: 1 %
Eosinophils Absolute: 0.3 10*3/uL (ref 0.0–0.5)
Eosinophils Relative: 5 %
HCT: 41 % (ref 39.0–52.0)
Hemoglobin: 13.6 g/dL (ref 13.0–17.0)
Immature Granulocytes: 0 %
Lymphocytes Relative: 26 %
Lymphs Abs: 1.5 10*3/uL (ref 0.7–4.0)
MCH: 31.3 pg (ref 26.0–34.0)
MCHC: 33.2 g/dL (ref 30.0–36.0)
MCV: 94.3 fL (ref 80.0–100.0)
Monocytes Absolute: 0.4 10*3/uL (ref 0.1–1.0)
Monocytes Relative: 8 %
Neutro Abs: 3.4 10*3/uL (ref 1.7–7.7)
Neutrophils Relative %: 60 %
Platelets: 174 10*3/uL (ref 150–400)
RBC: 4.35 MIL/uL (ref 4.22–5.81)
RDW: 12.7 % (ref 11.5–15.5)
WBC: 5.6 10*3/uL (ref 4.0–10.5)
nRBC: 0 % (ref 0.0–0.2)

## 2024-02-25 LAB — COMPREHENSIVE METABOLIC PANEL WITH GFR
ALT: 12 U/L (ref 0–44)
AST: 19 U/L (ref 15–41)
Albumin: 4 g/dL (ref 3.5–5.0)
Alkaline Phosphatase: 60 U/L (ref 38–126)
Anion gap: 9 (ref 5–15)
BUN: 12 mg/dL (ref 6–20)
CO2: 25 mmol/L (ref 22–32)
Calcium: 8.4 mg/dL — ABNORMAL LOW (ref 8.9–10.3)
Chloride: 102 mmol/L (ref 98–111)
Creatinine, Ser: 0.87 mg/dL (ref 0.61–1.24)
GFR, Estimated: >60 mL/min (ref >60)
Glucose, Bld: 107 mg/dL — ABNORMAL HIGH (ref 70–99)
Potassium: 3.6 mmol/L (ref 3.5–5.1)
Sodium: 136 mmol/L (ref 135–145)
Total Bilirubin: 0.6 mg/dL (ref 0.0–1.2)
Total Protein: 7.3 g/dL (ref 6.5–8.1)

## 2024-02-25 LAB — LIPASE, BLOOD: Lipase: 23 U/L (ref 11–51)

## 2024-02-25 MED ORDER — IOHEXOL 300 MG/ML  SOLN
100.0000 mL | Freq: Once | INTRAMUSCULAR | Status: AC | PRN
  Administered 2024-02-25: 100 mL via INTRAVENOUS

## 2024-02-25 NOTE — ED Notes (Addendum)
 Pt called x2 at [phone] d/t concerns for leaving with IV. Line rings once and then goes to voicemail that is full.

## 2024-02-25 NOTE — ED Notes (Signed)
 Charge RN informed pt departed with PIV

## 2024-02-25 NOTE — ED Notes (Signed)
 Pt returns for removal of IV

## 2024-02-26 ENCOUNTER — Other Ambulatory Visit: Payer: Self-pay

## 2024-02-26 ENCOUNTER — Encounter (HOSPITAL_COMMUNITY): Payer: Self-pay | Admitting: Emergency Medicine

## 2024-02-26 ENCOUNTER — Emergency Department (HOSPITAL_COMMUNITY)
Admission: EM | Admit: 2024-02-26 | Discharge: 2024-02-26 | Discharge status: Against Medical Advice | Attending: Emergency Medicine | Admitting: Emergency Medicine

## 2024-02-26 DIAGNOSIS — S70261A Insect bite (nonvenomous), right hip, initial encounter: Secondary | ICD-10-CM | POA: Insufficient documentation

## 2024-02-26 DIAGNOSIS — W57XXXA Bitten or stung by nonvenomous insect and other nonvenomous arthropods, initial encounter: Secondary | ICD-10-CM | POA: Insufficient documentation

## 2024-02-26 DIAGNOSIS — Z5321 Procedure and treatment not carried out due to patient leaving prior to being seen by health care provider: Secondary | ICD-10-CM | POA: Diagnosis not present

## 2024-02-26 DIAGNOSIS — R21 Rash and other nonspecific skin eruption: Secondary | ICD-10-CM | POA: Diagnosis present

## 2024-02-26 DIAGNOSIS — R609 Edema, unspecified: Secondary | ICD-10-CM | POA: Diagnosis not present

## 2024-02-26 DIAGNOSIS — I10 Essential (primary) hypertension: Secondary | ICD-10-CM | POA: Diagnosis not present

## 2024-02-26 NOTE — ED Triage Notes (Signed)
 Patient c/o insect bite on right hip x 1 hour. Small rash noted on right hip.  Patient denies SOB and chest pain.

## 2024-06-22 ENCOUNTER — Other Ambulatory Visit: Payer: Self-pay

## 2024-06-22 ENCOUNTER — Encounter (HOSPITAL_COMMUNITY): Payer: Self-pay | Admitting: Emergency Medicine

## 2024-06-22 ENCOUNTER — Emergency Department (HOSPITAL_COMMUNITY)
Admission: EM | Admit: 2024-06-22 | Discharge: 2024-06-22 | Discharge status: Against Medical Advice | Attending: Emergency Medicine | Admitting: Emergency Medicine

## 2024-06-22 DIAGNOSIS — Z5321 Procedure and treatment not carried out due to patient leaving prior to being seen by health care provider: Secondary | ICD-10-CM | POA: Diagnosis not present

## 2024-06-22 DIAGNOSIS — R112 Nausea with vomiting, unspecified: Secondary | ICD-10-CM | POA: Diagnosis not present

## 2024-06-22 DIAGNOSIS — R109 Unspecified abdominal pain: Secondary | ICD-10-CM | POA: Diagnosis present

## 2024-06-22 DIAGNOSIS — R1084 Generalized abdominal pain: Secondary | ICD-10-CM | POA: Diagnosis not present

## 2024-06-22 NOTE — ED Notes (Signed)
 CN went out to the waiting room to get the pt.  He indicated that he did not wish to be seen by a provider.  Pt left the ED.

## 2024-06-22 NOTE — ED Notes (Signed)
 Pt walked out to the waiting room to see a visitor but does not know the visitor's name.  RN offered to find person however he was unable to give a name and stated the person would not know his name.

## 2024-06-22 NOTE — ED Triage Notes (Signed)
 Per EMS pt from home reports upper L sided abdominal pain X1day.  Pt also reports n/v.  178/90, HR 74, 97% RA, CBG 129  Pt passed RN an note asking us  to call the [employer] not [employer].  Pt appears calm and cooperate

## 2024-06-24 ENCOUNTER — Emergency Department (HOSPITAL_COMMUNITY)
Admission: EM | Admit: 2024-06-24 | Discharge: 2024-06-25 | Disposition: A | Discharge status: Other Acute Inpatient Hospital | Source: Home / Self Care | Attending: Emergency Medicine | Admitting: Emergency Medicine

## 2024-06-24 ENCOUNTER — Other Ambulatory Visit: Payer: Self-pay

## 2024-06-24 DIAGNOSIS — R4689 Other symptoms and signs involving appearance and behavior: Secondary | ICD-10-CM

## 2024-06-24 DIAGNOSIS — F23 Brief psychotic disorder: Secondary | ICD-10-CM | POA: Insufficient documentation

## 2024-06-24 DIAGNOSIS — I10 Essential (primary) hypertension: Secondary | ICD-10-CM | POA: Insufficient documentation

## 2024-06-24 DIAGNOSIS — Z79899 Other long term (current) drug therapy: Secondary | ICD-10-CM | POA: Insufficient documentation

## 2024-06-24 DIAGNOSIS — F1910 Other psychoactive substance abuse, uncomplicated: Secondary | ICD-10-CM | POA: Insufficient documentation

## 2024-06-24 DIAGNOSIS — F29 Unspecified psychosis not due to a substance or known physiological condition: Secondary | ICD-10-CM | POA: Diagnosis not present

## 2024-06-24 DIAGNOSIS — R456 Violent behavior: Secondary | ICD-10-CM | POA: Insufficient documentation

## 2024-06-24 LAB — SALICYLATE LEVEL: Salicylate Lvl: <7 mg/dL — ABNORMAL LOW (ref 7.0–30.0)

## 2024-06-24 LAB — COMPREHENSIVE METABOLIC PANEL WITH GFR
ALT: 5 U/L (ref 0–44)
AST: 14 U/L — ABNORMAL LOW (ref 15–41)
Albumin: 4.2 g/dL (ref 3.5–5.0)
Alkaline Phosphatase: 63 U/L (ref 38–126)
Anion gap: 12 (ref 5–15)
BUN: <5 mg/dL — ABNORMAL LOW (ref 6–20)
CO2: 28 mmol/L (ref 22–32)
Calcium: 9.1 mg/dL (ref 8.9–10.3)
Chloride: 106 mmol/L (ref 98–111)
Creatinine, Ser: 0.86 mg/dL (ref 0.61–1.24)
GFR, Estimated: >60 mL/min (ref >60)
Glucose, Bld: 98 mg/dL (ref 70–99)
Potassium: 3.5 mmol/L (ref 3.5–5.1)
Sodium: 145 mmol/L (ref 135–145)
Total Bilirubin: 0.7 mg/dL (ref 0.0–1.2)
Total Protein: 6.5 g/dL (ref 6.5–8.1)

## 2024-06-24 LAB — CBC
HCT: 40.7 % (ref 39.0–52.0)
Hemoglobin: 13.3 g/dL (ref 13.0–17.0)
MCH: 30.1 pg (ref 26.0–34.0)
MCHC: 32.7 g/dL (ref 30.0–36.0)
MCV: 92.1 fL (ref 80.0–100.0)
Platelets: 161 K/uL (ref 150–400)
RBC: 4.42 MIL/uL (ref 4.22–5.81)
RDW: 12.3 % (ref 11.5–15.5)
WBC: 6.2 K/uL (ref 4.0–10.5)
nRBC: 0 % (ref 0.0–0.2)

## 2024-06-24 LAB — ETHANOL: Alcohol, Ethyl (B): <15 mg/dL (ref <15)

## 2024-06-24 LAB — ACETAMINOPHEN LEVEL: Acetaminophen (Tylenol), Serum: <10 ug/mL — ABNORMAL LOW (ref 10–30)

## 2024-06-24 MED ORDER — LORAZEPAM 1 MG PO TABS: 1.0000 mg | ORAL_TABLET | ORAL | Status: DC | PRN

## 2024-06-24 MED ORDER — STERILE WATER FOR INJECTION IJ SOLN
INTRAMUSCULAR | Status: AC
  Filled 2024-06-24: qty 10

## 2024-06-24 MED ORDER — OLANZAPINE 5 MG PO TBDP: 5.0000 mg | ORAL_TABLET | Freq: Three times a day (TID) | ORAL | Status: DC | PRN

## 2024-06-24 MED ORDER — ZIPRASIDONE MESYLATE 20 MG IM SOLR: 20.0000 mg | INTRAMUSCULAR | Status: DC | PRN

## 2024-06-24 MED ORDER — OLANZAPINE 10 MG PO TBDP
10.0000 mg | ORAL_TABLET | Freq: Once | ORAL | Status: AC
  Administered 2024-06-24: 10 mg via ORAL
  Filled 2024-06-24: qty 1

## 2024-06-24 MED ORDER — OLANZAPINE 10 MG PO TABS
10.0000 mg | ORAL_TABLET | Freq: Every day | ORAL | Status: DC
  Administered 2024-06-24: 10 mg via ORAL
  Filled 2024-06-24: qty 1

## 2024-06-24 MED ORDER — ZIPRASIDONE MESYLATE 20 MG IM SOLR
20.0000 mg | Freq: Once | INTRAMUSCULAR | Status: AC
  Administered 2024-06-24: 20 mg via INTRAMUSCULAR
  Filled 2024-06-24: qty 20

## 2024-06-24 NOTE — ED Notes (Signed)
 Pt refusing to change of of clothing. Pt refusing labs. Pt refusing to provide urine specimen.

## 2024-06-24 NOTE — ED Notes (Signed)
 Multiple attempts made by writer and techs to obtain EKG and UDS. Tech currently in room attempting EKG.

## 2024-06-24 NOTE — ED Provider Notes (Signed)
 The patient's EKG does not show any acute ischemic changes and the patient's QTc is 424.   Phillip Prentice SAUNDERS, MD 06/24/24 [phone]

## 2024-06-24 NOTE — ED Notes (Addendum)
 Staff from [employer] called for info on pt. Report given to Tiffany at [phone]. [facility] staff advised due to pt's previous behavior earlier today at [facility] along with pt sleeping after medication administration she advised she would call back at a later time to check up on pt.

## 2024-06-24 NOTE — ED Notes (Signed)
 Patient put on [place] scrubs and gray socks. Patient's belongings put in locker # 30.
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
## 2024-06-24 NOTE — ED Notes (Signed)
 [facility] called pts mother Keigen Caddell for collateral information. [facility] left a HIPAA compliant message to return the call.   Chesley Holt, [facility]  06/24/24

## 2024-06-24 NOTE — Consult Note (Signed)
 [facility] Psychiatric Consult Initial  Patient Name: [name]  MRN: [id number]  DOB: Oct 08, 1981  Consult Order details:  Orders (From admission, onward)     Start     Ordered   06/24/24 1446  CONSULT TO CALL ACT TEAM       Ordering Provider: Garrick Charleston, MD  Provider:  (Not yet assigned)  Question:  Reason for Consult?  Answer:  Psych consult   06/24/24 1445             Mode of Visit: In person    Psychiatry Consult Evaluation  Service Date: June 24, 2024 LOS:  LOS: 0 days  Chief Complaint A guy is threatening to kill me  Primary Psychiatric Diagnoses  Psychosis 2.  Polysubstance Abuse   Assessment  Phillip Anderson is a 43 y.o. male admitted: Presented to the EDfor 06/24/2024 10:38 AM for brought in by [employer] under IVC after going to the local FBI office and stating [name] was going to kill him. He carries the psychiatric diagnoses of polysubstance abuse and has a past medical history of  HTN, left shoulder tendinitis and back pain.   His current presentation of delusions and hallucinations is most consistent with psychosis. He meets criteria for inpatient hospitalization based on being a danger to others and acute psychosis.  Current outpatient psychotropic medications include none and historically he has had a N/A response to these medications. Compliance with medications prior to admission is unknown. On initial examination, patient is uncooperative and lethargic. Please see plan below for detailed recommendations.   Diagnoses:  Active Hospital problems: Principal Problem:   Psychosis (HCC)    Plan   ## Psychiatric Medication Recommendations:  --Zyprexa  10mg  PO Q day  ## Medical Decision Making Capacity: Not specifically addressed in this encounter  ## Further Work-up:   -- most recent EKG on 05/22/23 had QtC of 411; updated EKG pending -- Pertinent labwork reviewed earlier this admission includes: CBC, CMP; UDS pending   ## Disposition:-- We  recommend inpatient psychiatric hospitalization after medical hospitalization. Patient has been involuntarily committed on 06/24/24.   ## Behavioral / Environmental: - No specific recommendations at this time.     ## Safety and Observation Level:  - Based on my clinical evaluation, I estimate the patient to be at low risk of self harm in the current setting. - At this time, we recommend  routine. This decision is based on my review of the chart including patient's history and current presentation, interview of the patient, mental status examination, and consideration of suicide risk including evaluating suicidal ideation, plan, intent, suicidal or self-harm behaviors, risk factors, and protective factors. This judgment is based on our ability to directly address suicide risk, implement suicide prevention strategies, and develop a safety plan while the patient is in the clinical setting. Please contact our team if there is a concern that risk level has changed.  CSSR Risk Category:   Suicide Risk Assessment: Patient has following modifiable risk factors for suicide: recklessness, active mental illness (to encompass adhd, tbi, mania, psychosis, trauma reaction), and recent loss (death, isolation, vocation), which we are addressing by recommending inpatient psychiatric hospitalization. Patient has following non-modifiable or demographic risk factors for suicide: male gender Patient has the following protective factors against suicide: Access to outpatient mental health care and Supportive family  Thank you for this consult request. Recommendations have been communicated to the primary team.  We will continue to follow at this time.   Maikayla Beggs A  Deloros Beretta, NP       History of Present Illness  Relevant Aspects of Hospital ED Course:  Admitted on 06/24/2024 for brought in by [employer] under IVC after going to the local [employer] office and stating [name] was going to kill him. He carries the psychiatric diagnoses of  polysubstance abuse and has a past medical history of  HTN, left shoulder tendinitis and back pain.    Patient Report:  Phillip Anderson, is seen face to face by this provider, consulted with Dr. Larina; and chart reviewed on 06/24/24.  On evaluation Phillip Anderson reports a guy is threatening to kill me.  Patient says this guy is threatening him in person and he doesn't know why no one else sees him.  Patient refuses to answer an other questions during assessment.   Per IVC, yesterday was the anniversary of the murder of patient's daughter. He was threatening to kill [name] to protect himself and his family and he believes [name] has put devices in his ears so he can hear what patient says.      During evaluation Phillip Anderson is laying in bed in no acute distress.  He is alert & oriented x 4, calm, and uncooperative for this assessment.  His mood is flat with congruent affect.  He has normal speech, and bizarre behavior covering head with blanket and refusing to make eye contact.  Objectively there is evidence of psychosis/mania and delusional thinking. Pt does appear to be responding to internal or external stimuli.  Patient is unable to converse coherently.  He denies suicidal/self-harm/homicidal ideation, psychosis, and paranoia.    I personally spent a total of 60 minutes in the care of the patient today including preparing to see the patient, getting/reviewing separately obtained history, performing a medically appropriate exam/evaluation, counseling and educating, placing orders, referring and communicating with other health care professionals, documenting clinical information in the EHR, independently interpreting results, and coordinating care.  Psych ROS:  Depression: denies Anxiety:  denies Mania (lifetime and current): denies Psychosis: (lifetime and current): denies  Collateral information:  Contacted Francis Doenges at [phone] on 06/24/24 See note by Chesley Holt  [facility]  Review of Systems  Psychiatric/Behavioral:  Positive for hallucinations and substance abuse.   All other systems reviewed and are negative.    Psychiatric and Social History  Psychiatric History:  Information collected from patient and chart review  Prev Dx/Sx: polysubstance abuse Current Psych Provider: none Home Meds (current): none Previous Med Trials: none Therapy: none  Prior Psych Hospitalization: 2013 for polysubstance abuse Prior Self Harm: unknown Prior Violence: unknown  Family Psych History: unknown Family Hx suicide: unknown  Social History:  Developmental Hx: WNL Educational Hx: did not graduate high school Occupational Hx: Engineer, materials Hx: unknown Living Situation: Lives with Mom Spiritual Hx: unknown Access to weapons/lethal means: unknown   Substance History Alcohol: yes  Type of alcohol UTA Last Drink UTA Number of drinks per day UTA History of alcohol withdrawal seizures UTA History of DT's UTA Tobacco: yes Illicit drugs: cocaine and THC Prescription drug abuse: denies Rehab hx: unknown  Exam Findings  Physical Exam:  Vital Signs:  Temp:  [99.7 F (37.6 C)] 99.7 F (37.6 C) (08/27 1042) Pulse Rate:  [84] 84 (08/27 1042) Resp:  [18] 18 (08/27 1042) BP: (167)/(103) 167/103 (08/27 1042) SpO2:  [100 %] 100 % (08/27 1042) Blood pressure (!) 167/103, pulse 84, temperature 99.7 F (37.6 C), temperature source Oral, resp. rate 18, SpO2 100%. There  is no height or weight on file to calculate BMI.  Physical Exam Vitals and nursing note reviewed.  Constitutional:      Appearance: He is obese.  Pulmonary:     Effort: Pulmonary effort is normal.  Skin:    General: Skin is dry.  Neurological:     Mental Status: He is alert and oriented to person, place, and time.  Psychiatric:        Attention and Perception: He perceives auditory and visual hallucinations.        Mood and Affect: Affect is flat.        Speech: Speech normal.         Behavior: Behavior is uncooperative, withdrawn and actively hallucinating.        Thought Content: Thought content is paranoid and delusional.        Judgment: Judgment is impulsive.     Mental Status Exam: General Appearance: Casual  Orientation:  Full (Time, Place, and Person)  Memory:  UTA  Concentration:  UTA  Recall:  UTA  Attention  UTA  Eye Contact:  Absent  Speech:  Clear and Coherent  Language:  Fair  Volume:  Decreased  Mood: Flat  Affect:  Flat  Thought Process:  Disorganized  Thought Content:  Delusions and Hallucinations: Auditory Visual  Suicidal Thoughts:  No  Homicidal Thoughts:  No  Judgement:  Impaired  Insight:  Lacking  Psychomotor Activity:  Normal  Akathisia:  No  Fund of Knowledge:  Poor      Assets:  Housing Social Support  Cognition:  WNL  ADL's:  Intact  AIMS (if indicated):        Other History   These have been pulled in through the EMR, reviewed, and updated if appropriate.  Family History:  The patient's family history is not on file.  Medical History: Past Medical History:  Diagnosis Date  . Allergic   . BACK PAIN 09/30/2007   Qualifier: Diagnosis of  By: Krystal MD, Reyes LABOR   . Chest pain   . CHEST WALL PAIN, ACUTE 12/11/2007   Qualifier: Diagnosis of  By: Krystal MD, Reyes LABOR   . Depression   . Esophagitis   . Essential hypertension 02/26/2022  . Folliculitis   . Lumbar strain   . No pertinent past medical history   . Pain in anterior left upper extremity   . Polysubstance dependence including opioid type drug, episodic abuse (HCC)   . Pulmonary nodule 02/26/2022  . TENDINITIS, SHOULDER, LEFT 09/14/2008   Qualifier: Diagnosis of  By: Krystal MD, Reyes LABOR   . Testicular pain   . TOBACCO ABUSE 12/19/2007   Qualifier: Diagnosis of  By: Krystal MD, Reyes LABOR     Surgical History: Past Surgical History:  Procedure Laterality Date  . ARTERY AND TENDON REPAIR Left 07/21/2016   Procedure: LEFT ARM EXPLORATION;  Surgeon: Balinda Rogue, MD;  Location: [facility] OR;  Service: Orthopedics;  Laterality: Left;  . I & D EXTREMITY Right 05/23/2022   Procedure: IRRIGATION AND DEBRIDEMENT RIGHT ARM;  Surgeon: Romona Harari, MD;  Location: [facility];  Service: Orthopedics;  Laterality: Right;     Medications:   Current Facility-Administered Medications:  .  OLANZapine  zydis (ZYPREXA ) disintegrating tablet 5 mg, 5 mg, Oral, Q8H PRN **AND** LORazepam  (ATIVAN ) tablet 1 mg, 1 mg, Oral, PRN **AND** ziprasidone  (GEODON ) injection 20 mg, 20 mg, Intramuscular, PRN, Garrick Charleston, MD  Current Outpatient Medications:  .  ibuprofen  (ADVIL ) 800 MG tablet,  Take 1 tablet (800 mg total) by mouth every 8 (eight) hours as needed for up to 21 doses., Disp: 21 tablet, Rfl: 0 .  losartan  (COZAAR ) 25 MG tablet, Take 1 tablet (25 mg total) by mouth daily., Disp: 30 tablet, Rfl: 1 .  senna-docusate (SENOKOT-S) 8.6-50 MG tablet, Take 1 tablet by mouth at bedtime as needed for mild constipation., Disp: 20 tablet, Rfl: 0  Allergies: Allergies  Allergen Reactions  . Bee Venom Swelling and Anaphylaxis  . Hornet Venom Anaphylaxis    Required epinephrine     Efrain DELENA Patient, NP

## 2024-06-24 NOTE — ED Notes (Addendum)
 Writer attempted to do EKG and obtain urine drug screen, however pt was agitated and told writer it's bullshit. Pt wanted to know why writer needed an EKG and a UDS. Pt was adamant that he didn't need anything done and to not touch him. Writer explained very calmly to pt the reasons for the EKG and UDS, but pt advised to leave him alone.

## 2024-06-24 NOTE — ED Notes (Addendum)
 [place] Research officer, political party and got an update on pt. Writer spoke with Dorn PEAK and he advised this pt was going to be admitted to a unit tomorrow pending UDS.

## 2024-06-24 NOTE — Progress Notes (Signed)
 Pt has been accepted to [facility] on 06/24/2024  . Bed assignment:503-1  Pt meets inpatient criteria per [name], NP   Attending Physician will be Dr. Raliegh   Report can be called to: Adult unit: [phone]  Pt can arrive pending labs   Care Team Notified: [facility] Cherylynn Ernst, RN, Stevens Pereyra, Paramedic, [name], NP

## 2024-06-24 NOTE — ED Notes (Signed)
 Pt refused geodon , eventually agreeable to po zyprexa 

## 2024-06-24 NOTE — Progress Notes (Signed)
 LCSW Progress Note  [id number]   Phillip Anderson  06/24/2024  3:55 PM  Description:   Inpatient Psychiatric Referral  Patient was recommended inpatient per [name] (NP). There are no available beds at [facility], per [facility] Carlo RN. Patient was referred to the following out of network facilities:   [facility] Provider Address Phone Fax  [facility]  [address], [place] [address] [phone]  [facility]  [address] [place] [address] [phone]  [facility]  [address] [place], [place] [address] [phone]  [facility]  [address] [place] [address] [phone]  [facility]  [address] [place] [address] [phone]  [facility]  [address], [place] [address] [phone]  [facility]  [address], [place] [address] [phone]  [facility]  [address], [place] [address] [phone]  [facility] E[facility]  [address] [place], [place] [phone]  [facility]  [address] [place] [address] [phone]  [facility]  [address] [place] [address] [phone]      Situation ongoing, CSW to continue following and update chart as more information becomes available.      [place] Luzelena Heeg , MSW, LCSW  06/24/2024 3:55 PM

## 2024-06-24 NOTE — ED Notes (Signed)
 Pt is refusing labs and U/A. Pt states that he is just here to get an X-ray and that he is too nervous right now. RN informed.

## 2024-06-24 NOTE — ED Triage Notes (Signed)
 Pt arrives under IVC by [employer]. Pt went to local FBI office stating that '[name]' was going to kill him and his family. Detectives called, determined threat was not real. [facility]RT called, initiated IVC. Pt very agitated and paranoid. Believes hospital staff is being directed by '[name]'. Refused bloodwork.

## 2024-06-24 NOTE — ED Notes (Signed)
 Tech obtained EGK, however it would not cross over into EPIC. Writer gave Dr. Ginger the EKG and then secretary scanned EKG into pt's chart.

## 2024-06-25 ENCOUNTER — Inpatient Hospital Stay (HOSPITAL_COMMUNITY)
Admission: AD | Admit: 2024-06-25 | Discharge: 2024-06-26 | DRG: 885 | Disposition: A | Discharge status: Home / Self Care | Source: Intra-hospital | Attending: Student in an Organized Health Care Education/Training Program | Admitting: Student in an Organized Health Care Education/Training Program

## 2024-06-25 ENCOUNTER — Encounter (HOSPITAL_COMMUNITY): Payer: Self-pay | Admitting: Psychiatry

## 2024-06-25 DIAGNOSIS — F14129 Cocaine abuse with intoxication, unspecified: Secondary | ICD-10-CM | POA: Diagnosis present

## 2024-06-25 DIAGNOSIS — Z79899 Other long term (current) drug therapy: Secondary | ICD-10-CM

## 2024-06-25 DIAGNOSIS — R456 Violent behavior: Secondary | ICD-10-CM | POA: Diagnosis not present

## 2024-06-25 DIAGNOSIS — F1914 Other psychoactive substance abuse with psychoactive substance-induced mood disorder: Secondary | ICD-10-CM | POA: Diagnosis not present

## 2024-06-25 DIAGNOSIS — Z635 Disruption of family by separation and divorce: Secondary | ICD-10-CM | POA: Diagnosis not present

## 2024-06-25 DIAGNOSIS — F1414 Cocaine abuse with cocaine-induced mood disorder: Secondary | ICD-10-CM | POA: Diagnosis present

## 2024-06-25 DIAGNOSIS — I10 Essential (primary) hypertension: Secondary | ICD-10-CM | POA: Diagnosis present

## 2024-06-25 DIAGNOSIS — F17213 Nicotine dependence, cigarettes, with withdrawal: Secondary | ICD-10-CM | POA: Diagnosis present

## 2024-06-25 DIAGNOSIS — F1994 Other psychoactive substance use, unspecified with psychoactive substance-induced mood disorder: Principal | ICD-10-CM | POA: Insufficient documentation

## 2024-06-25 DIAGNOSIS — Z91030 Bee allergy status: Secondary | ICD-10-CM | POA: Diagnosis not present

## 2024-06-25 DIAGNOSIS — F23 Brief psychotic disorder: Principal | ICD-10-CM | POA: Diagnosis present

## 2024-06-25 DIAGNOSIS — F29 Unspecified psychosis not due to a substance or known physiological condition: Secondary | ICD-10-CM | POA: Diagnosis not present

## 2024-06-25 DIAGNOSIS — F1910 Other psychoactive substance abuse, uncomplicated: Secondary | ICD-10-CM | POA: Diagnosis not present

## 2024-06-25 LAB — URINALYSIS, ROUTINE W REFLEX MICROSCOPIC
Bilirubin Urine: NEGATIVE
Glucose, UA: NEGATIVE mg/dL
Hgb urine dipstick: NEGATIVE
Ketones, ur: NEGATIVE mg/dL
Leukocytes,Ua: NEGATIVE
Nitrite: NEGATIVE
Protein, ur: NEGATIVE mg/dL
Specific Gravity, Urine: 1.002 — ABNORMAL LOW (ref 1.005–1.030)
pH: 6 (ref 5.0–8.0)

## 2024-06-25 LAB — URINE DRUG SCREEN
Amphetamines: NEGATIVE
Barbiturates: NEGATIVE
Benzodiazepines: NEGATIVE
Cocaine: POSITIVE — AB
Fentanyl: POSITIVE — AB
Methadone Scn, Ur: NEGATIVE
Opiates: NEGATIVE
Tetrahydrocannabinol: NEGATIVE

## 2024-06-25 MED ORDER — METHOCARBAMOL 500 MG PO TABS
500.0000 mg | ORAL_TABLET | Freq: Three times a day (TID) | ORAL | Status: DC | PRN
  Administered 2024-06-25: 500 mg via ORAL
  Filled 2024-06-25: qty 1

## 2024-06-25 MED ORDER — HALOPERIDOL LACTATE 5 MG/ML IJ SOLN: 5.0000 mg | Freq: Three times a day (TID) | INTRAMUSCULAR | Status: DC | PRN

## 2024-06-25 MED ORDER — ALUM & MAG HYDROXIDE-SIMETH 200-200-20 MG/5ML PO SUSP: 30.0000 mL | ORAL | Status: DC | PRN

## 2024-06-25 MED ORDER — DIPHENHYDRAMINE HCL 25 MG PO CAPS: 50.0000 mg | ORAL_CAPSULE | Freq: Three times a day (TID) | ORAL | Status: DC | PRN

## 2024-06-25 MED ORDER — DIPHENHYDRAMINE HCL 50 MG/ML IJ SOLN: 50.0000 mg | Freq: Three times a day (TID) | INTRAMUSCULAR | Status: DC | PRN

## 2024-06-25 MED ORDER — ACETAMINOPHEN 325 MG PO TABS
650.0000 mg | ORAL_TABLET | Freq: Four times a day (QID) | ORAL | Status: DC | PRN
  Administered 2024-06-25: 650 mg via ORAL
  Filled 2024-06-25: qty 2

## 2024-06-25 MED ORDER — NAPROXEN 500 MG PO TABS
500.0000 mg | ORAL_TABLET | Freq: Two times a day (BID) | ORAL | Status: DC | PRN
Start: 2024-06-25 — End: 2024-06-30
  Administered 2024-06-25: 500 mg via ORAL
  Filled 2024-06-25: qty 1

## 2024-06-25 MED ORDER — HALOPERIDOL 5 MG PO TABS
5.0000 mg | ORAL_TABLET | Freq: Three times a day (TID) | ORAL | Status: DC | PRN
Start: 2024-06-25 — End: 2024-06-26

## 2024-06-25 MED ORDER — MAGNESIUM HYDROXIDE 400 MG/5ML PO SUSP: 30.0000 mL | Freq: Every day | ORAL | Status: DC | PRN

## 2024-06-25 MED ORDER — LOPERAMIDE HCL 2 MG PO CAPS: 2.0000 mg | ORAL_CAPSULE | ORAL | Status: DC | PRN

## 2024-06-25 MED ORDER — OLANZAPINE 10 MG PO TABS
10.0000 mg | ORAL_TABLET | Freq: Every day | ORAL | Status: DC
  Administered 2024-06-25: 10 mg via ORAL
  Filled 2024-06-25: qty 1

## 2024-06-25 MED ORDER — DICYCLOMINE HCL 20 MG PO TABS
20.0000 mg | ORAL_TABLET | Freq: Four times a day (QID) | ORAL | Status: DC | PRN
  Administered 2024-06-25: 20 mg via ORAL
  Filled 2024-06-25: qty 1

## 2024-06-25 MED ORDER — HALOPERIDOL LACTATE 5 MG/ML IJ SOLN: 10.0000 mg | Freq: Three times a day (TID) | INTRAMUSCULAR | Status: DC | PRN

## 2024-06-25 MED ORDER — HYDROXYZINE HCL 25 MG PO TABS
25.0000 mg | ORAL_TABLET | Freq: Four times a day (QID) | ORAL | Status: DC | PRN
  Administered 2024-06-25: 25 mg via ORAL
  Filled 2024-06-25: qty 1

## 2024-06-25 MED ORDER — LOSARTAN POTASSIUM 25 MG PO TABS
25.0000 mg | ORAL_TABLET | Freq: Every day | ORAL | Status: DC
  Administered 2024-06-25 – 2024-06-26 (×2): 25 mg via ORAL
  Filled 2024-06-25 (×2): qty 1

## 2024-06-25 MED ORDER — LORAZEPAM 2 MG/ML IJ SOLN: 2.0000 mg | Freq: Three times a day (TID) | INTRAMUSCULAR | Status: DC | PRN

## 2024-06-25 MED ORDER — NICOTINE POLACRILEX 2 MG MT GUM
2.0000 mg | CHEWING_GUM | OROMUCOSAL | Status: DC | PRN
  Administered 2024-06-25 – 2024-06-26 (×3): 2 mg via ORAL
  Filled 2024-06-25 (×3): qty 1

## 2024-06-25 MED ORDER — IBUPROFEN 800 MG PO TABS
800.0000 mg | ORAL_TABLET | Freq: Three times a day (TID) | ORAL | Status: DC | PRN
  Administered 2024-06-25: 800 mg via ORAL
  Filled 2024-06-25: qty 1

## 2024-06-25 MED ORDER — ONDANSETRON 4 MG PO TBDP
4.0000 mg | ORAL_TABLET | Freq: Four times a day (QID) | ORAL | Status: DC | PRN
  Filled 2024-06-25: qty 1

## 2024-06-25 MED ORDER — NICOTINE 14 MG/24HR TD PT24
14.0000 mg | MEDICATED_PATCH | Freq: Every day | TRANSDERMAL | Status: DC
  Administered 2024-06-25 – 2024-06-26 (×2): 14 mg via TRANSDERMAL
  Filled 2024-06-25 (×2): qty 1

## 2024-06-25 MED ORDER — TRAZODONE HCL 100 MG PO TABS
100.0000 mg | ORAL_TABLET | Freq: Every evening | ORAL | Status: DC | PRN
  Administered 2024-06-25: 100 mg via ORAL
  Filled 2024-06-25 (×2): qty 1

## 2024-06-25 NOTE — Progress Notes (Signed)
   06/25/24 1000  Psych Admission Type (Psych Patients Only)  Admission Status Involuntary  Psychosocial Assessment  Patient Complaints Irritability;Worrying  Eye Contact Brief  Facial Expression Anxious  Affect Irritable  Speech Pressured  Interaction Guarded;Evasive  Motor Activity Fidgety  Appearance/Hygiene In scrubs  Behavior Characteristics Anxious;Irritable  Mood Anxious;Irritable;Preoccupied  Thought Process  Coherency Circumstantial  Content Blaming others;Paranoia;Preoccupation  Delusions Paranoid  Perception WDL  Hallucination None reported or observed  Judgment Limited  Confusion None  Danger to Self  Current suicidal ideation? Denies  Danger to Others  Danger to Others None reported or observed

## 2024-06-25 NOTE — Progress Notes (Signed)
 Admission note:  Patient is a 43 year old [place] American male that presents to this facility under an IVC. Patient alert and ambulatory. Per chart, patient went to the local FBI office and stated that, people are trying to kill him. Upon arrival, patient states, gang members are trying to kill my family and they brought me here I dont know why. Patient alert and oriented to person, place, time. Disoriented to situation. Patient irritable and states, I dont want to be here, I am ready to go home. Patient appearance is unremarkable and is in scrubs. Patient denies SI/HI/AVH at this time. Patient able to contract for safety. Consents signed. Rules and regulations of the unit reviewed with patient. Q15 mins safety checks made aware to patient. Patient oriented around the unit. Hydration given, patient denies having any questions at this time.

## 2024-06-25 NOTE — [facility] Counselor (Addendum)
 Adult Comprehensive Assessment  Patient ID: Phillip Anderson, male   DOB: 28-Jan-1981, 43 y.o.   MRN: [id number]  Information Source: Information source: Patient  Current Stressors:  Patient states their primary concerns and needs for treatment are:: I don't know, they just brought me here Patient states their goals for this hospitilization and ongoing recovery are:: I want to leave the hospital.  I don't want to be in here. Educational / Learning stressors: no Employment / Job issues: no Family Relationships: no Surveyor, quantity / Lack of resources (include bankruptcy): no Housing / Lack of housing: no Physical health (include injuries & life threatening diseases): no Social relationships: no Substance abuse: no Bereavement / Loss: no  Living/Environment/Situation:  Living Arrangements: Alone Living conditions (as described by patient or guardian): fine Who else lives in the home?: I stay with my mom and dad. How long has patient lived in current situation?: a year What is atmosphere in current home: Comfortable, Loving, Supportive  Family History:  Marital status: Separated Separated, when?: I've been staying with my parents for a year and a half now. What types of issues is patient dealing with in the relationship?: everything was fine Additional relationship information: none reported Are you sexually active?: No What is your sexual orientation?: I'm straight Has your sexual activity been affected by drugs, alcohol, medication, or emotional stress?: no Does patient have children?: Yes How many children?: 3 How is patient's relationship with their children?: I had 3 children but one of them got shot and killed in 2017.  When asked about his relationship with his  children, he responded, it's fine.  Childhood History:  By whom was/is the patient raised?: Mother Additional childhood history information: none reported Description of patient's  relationship with caregiver when they were a child: good Patient's description of current relationship with people who raised him/her: fine How were you disciplined when you got in trouble as a child/adolescent?: I got whooped Does patient have siblings?: Yes Number of Siblings: 10 Description of patient's current relationship with siblings: good Did patient suffer any verbal/emotional/physical/sexual abuse as a child?: No Did patient suffer from severe childhood neglect?: No Has patient ever been sexually abused/assaulted/raped as an adolescent or adult?: No Was the patient ever a victim of a crime or a disaster?: No Witnessed domestic violence?: No Has patient been affected by domestic violence as an adult?: No  Education:  Highest grade of school patient has completed: I completed the 11th grade Currently a student?: No Learning disability?: No  Employment/Work Situation:   Employment Situation: Employed 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 Are You Satisfied With Your Job?: Yes Do You Work More Than One Job?: Yes Work Stressors: There is nothing stresssful about the work; it's being here that's stressful. Patient's Job has Been Impacted by Current Illness: No What is the Longest Time Patient has Held a Job?: 20-something years Where was the Patient Employed at that Time?: [employer] Has Patient ever Been in the [employer]?: No  Financial Resources:   Financial resources: Income from employment, Medicaid Does patient have a representative payee or guardian?: No  Alcohol/Substance Abuse:   What has been your use of drugs/alcohol within the last 12 months?: marijuana - every chance I get. If attempted suicide, did drugs/alcohol play a role in this?: No If yes, describe treatment: no Has alcohol/substance abuse ever caused legal problems?: No  Social Support System:    [employer]  Support System: Good Describe Community Support System: my mom and dad Type of faith/religion: Christian How does patient's faith help to cope with current illness?: I pray  Leisure/Recreation:   Do You Have Hobbies?: Yes Leisure and Hobbies: whatever  Strengths/Needs:   What is the patient's perception of their strengths?: I'm good at what I do. Patient states they can use these personal strengths during their treatment to contribute to their recovery: I'm pretty good at communication Patient states these barriers may affect/interfere with their treatment: staying in the hospital Patient states these barriers may affect their return to the community: none reported Other important information patient would like considered in planning for their treatment: none reported  Discharge Plan:   Patient states concerns and preferences for aftercare planning are: 'I don't  need a psychiatrist or therapist. Patient states they will know when they are safe and ready for discharge when: I'm ready to leave the hospital now. Does patient have access to transportation?: Yes Does patient have financial barriers related to discharge medications?: Yes Patient description of barriers related to discharge medications: none reported Will patient be returning to same living situation after discharge?: Yes  Summary/Recommendations:   Summary and Recommendations (to be completed by the evaluator): Phillip Anderson is a 43 year old man involuntarily admitted to [facility] due to paranoia and agitation.  Patient went to a local FBI office stating that 'Phillip Anderson' was going to kill him and his family.  Detectives were called and determined that the threat was not real. Patient believed that the hospital staff is being directed by 'Phillip Anderson.'  During the assessment, patient stated that he didn't know why he was in the hospital, and said several times that he is ready to be discharged now.  He said  that he didn't have any stressors.  Patient said that he is working in [employer], and that his job is not stressful.  Patient said that he lives with his parents, where he will return upon discharge.  Patient admitted to using marijuana every chance I get.  The urinary drug test was conducted today (admission date), and the results are pending.  Patient said that he has 3 children, and one of them was shot and killed in 2017.  He showed CSW a tattoo in memory of his child.  Patient said that he doesn't need a psychiatrist or therapist.  While here, Phillip Anderson can benefit from crisis stabilization, medication management, therapeutic milieu, and referrals for services.   Phillip Anderson O Phillip Anderson, LCSWA  06/25/2024

## 2024-06-25 NOTE — Progress Notes (Signed)
   06/25/24 2000  Psych Admission Type (Psych Patients Only)  Admission Status Involuntary  Psychosocial Assessment  Patient Complaints Worrying  Eye Contact Fair  Facial Expression Animated  Affect Appropriate to circumstance  Speech Logical/coherent  Interaction Assertive  Motor Activity Fidgety  Appearance/Hygiene Unremarkable  Behavior Characteristics Cooperative;Appropriate to situation;Anxious  Mood Anxious;Pleasant  Thought Process  Coherency Circumstantial  Content Blaming others;Paranoia  Delusions Paranoid  Perception WDL  Hallucination None reported or observed  Judgment Limited  Confusion None  Danger to Self  Current suicidal ideation? Denies  Danger to Others  Danger to Others None reported or observed

## 2024-06-25 NOTE — Plan of Care (Signed)
   Problem: Activity: Goal: Interest or engagement in activities will improve Outcome: Progressing   Problem: Coping: Goal: Ability to verbalize frustrations and anger appropriately will improve Outcome: Progressing   Problem: Health Behavior/Discharge Planning: Goal: Identification of resources available to assist in meeting health care needs will improve Outcome: Progressing
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
## 2024-06-25 NOTE — [facility] Group Notes (Signed)
 Adult Psychoeducational Group Note  Date:  06/25/2024 Time:  8:42 PM  Group Topic/Focus:  Wrap-Up Group:   The focus of this group is to help patients review their daily goal of treatment and discuss progress on daily workbooks.  Participation Level:  Active  Participation Quality:  Appropriate  Affect:  Appropriate  Cognitive:  Appropriate  Insight: Appropriate  Engagement in Group:  Engaged  Modes of Intervention:  Discussion  Additional Comments:  Pt attended group.  Drue Pouch 06/25/2024, 8:42 PM

## 2024-06-25 NOTE — [facility] Suicide Risk Assessment (Addendum)
 [facility] INPATIENT:  Family/Significant Other Suicide Prevention Education  Suicide Prevention Education:  Contact Attempts: Arhaan Chesnut (mom) [phone], (name of family member/significant other) has been identified by the patient as the family member/significant other with whom the patient will be residing, and identified as the person(s) who will aid the patient in the event of a mental health crisis.  With written consent from the patient, two attempts were made to provide suicide prevention education, prior to and/or following the patient's discharge.  We were unsuccessful in providing suicide prevention education.  A suicide education pamphlet was given to the patient to share with family/significant other.  Date and time of first attempt: 06/25/2024 / 7:23 PM  CSW left a voicemail.   Maira Christon O Damoney Julia, LCSWA 06/25/2024, 7:23 PM
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
## 2024-06-25 NOTE — Progress Notes (Signed)
(  Sleep Hours) - 3  (Any PRNs that were needed, meds refused, or side effects to meds)- N/A  (Any disturbances and when (visitation, over night)- N/A  (Concerns raised by the patient)- N/A  (SI/HI/AVH)- DENIES

## 2024-06-25 NOTE — Progress Notes (Signed)
 Conversation with patient:  Patient refused to sign request & authorization for use / disclosure of protected health information explaining that he doesn't need a psychiatrist or a therapist.   Lachanda Buczek, LCSWA 06/25/2024

## 2024-06-25 NOTE — Group Note (Signed)
 Recreation Therapy Group Note   Group Topic:Healthy Decision Making  Group Date: 06/25/2024 Start Time: 1005 End Time: 1025 Facilitators: Tylek Boney-McCall, LRT,CTRS Location: [address] Dayroom   Group Topic: Decision Making, Problem Solving, Communication  Goal Area(s) Addresses:  Patient will effectively work with peer towards shared goal.  Patient will identify factors that guided their decision making.  Patient will pro-socially communicate ideas during group session.   Behavioral Response:   Intervention: Survival Scenario - pencil, paper  Activity: Patients were given a scenario that they were going to be stranded on a deserted [place] for several months before being rescued. Writer tasked them with making a list of 15 things they would choose to bring with them for survival. The list of items was prioritized most important to least. Each patient would come up with their own list, then work together to create a new list of 15 items while in a group of 3-5 peers. LRT discussed each person's list and how it differed from others. The debrief included discussion of priorities, good decisions versus bad decisions, and how it is important to think before acting so we can make the best decision possible. LRT tied the concept of effective communication among group members to patient's support systems outside of the hospital and its benefit post discharge.  Education: Pharmacist, community, Priorities, Support System, Discharge Planning   Education Outcome: Acknowledges education/In group clarification/Needs additional education   Affect/Mood: Irritable   Participation Level: None   Participation Quality: Independent   Behavior: Distracted and On-looking   Speech/Thought Process: Distracted   Insight: None   Judgement: None   Modes of Intervention: Decision Making   Patient Response to Interventions:  Disengaged   Education Outcome:  In group clarification offered     Clinical Observations/Individualized Feedback: Pt didn't engage in group activity. Pt observed and listened to peers. Pt appeared aggravated and was focused on going home and getting to work. Pt emphasized he would be missing out on nice size payment if he missed the job he had lined up. Pt referred to it many times and stated he was really going to get upset if he missed that job.    Plan: Continue to engage patient in RT group sessions 2-3x/week.   Tyreisha Ungar-McCall, LRT,CTRS 06/25/2024 12:43 PM

## 2024-06-25 NOTE — Group Note (Signed)
 Date:  06/25/2024 Time:  3:46 PM  Group Topic/Focus:  Goals Group:   The focus of this group is to help patients establish daily goals to achieve during treatment and discuss how the patient can incorporate goal setting into their daily lives to aide in recovery. Orientation:   The focus of this group is to educate the patient on the purpose and policies of crisis stabilization and provide a format to answer questions about their admission.  The group details unit policies and expectations of patients while admitted.    Participation Level:  Active  Participation Quality:  Appropriate  Affect:  Appropriate  Cognitive:  Appropriate  Insight: Appropriate  Engagement in Group:  Engaged  Modes of Intervention:  Discussion  Additional Comments:  Pt goal is to work on his discharge plan.   Phillip Anderson 06/25/2024, 3:46 PM

## 2024-06-25 NOTE — Progress Notes (Signed)
   06/25/24 0400  Psych Admission Type (Psych Patients Only)  Admission Status Involuntary  Psychosocial Assessment  Patient Complaints Irritability;Worrying;Anxiety  Eye Contact Avoids  Facial Expression Anxious  Affect Irritable  Speech Pressured  Interaction Minimal  Motor Activity Slow  Appearance/Hygiene In scrubs  Behavior Characteristics Anxious;Irritable  Mood Anxious;Labile;Irritable  Thought Process  Coherency Circumstantial  Content Blaming others;Paranoia;Preoccupation  Delusions Paranoid  Perception WDL  Hallucination None reported or observed  Judgment Limited  Confusion None  Danger to Self  Current suicidal ideation? Denies  Danger to Others  Danger to Others None reported or observed

## 2024-06-25 NOTE — [facility] Group Notes (Signed)
 Adult Psychoeducational Group Note  Date:  06/25/2024 Time:  8:39 PM  Group Topic/Focus:  Wrap-Up Group:   The focus of this group is to help patients review their daily goal of treatment and discuss progress on daily workbooks.  Participation Level:  Active  Participation Quality:  Appropriate  Affect:  Appropriate  Cognitive:  Appropriate  Insight: Appropriate  Engagement in Group:  Engaged  Modes of Intervention:  Discussion  Additional Comments:  Pt attended group.  Drue Pouch 06/25/2024, 8:39 PM
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
## 2024-06-26 DIAGNOSIS — F1994 Other psychoactive substance use, unspecified with psychoactive substance-induced mood disorder: Principal | ICD-10-CM | POA: Insufficient documentation

## 2024-06-26 DIAGNOSIS — F1914 Other psychoactive substance abuse with psychoactive substance-induced mood disorder: Secondary | ICD-10-CM

## 2024-06-26 DIAGNOSIS — F23 Brief psychotic disorder: Principal | ICD-10-CM

## 2024-06-26 MED ORDER — NICOTINE 14 MG/24HR TD PT24: 14.0000 mg | MEDICATED_PATCH | Freq: Every day | TRANSDERMAL | 0 refills | Status: DC

## 2024-06-26 MED ORDER — OLANZAPINE 10 MG PO TABS: 10.0000 mg | ORAL_TABLET | Freq: Every day | ORAL | 0 refills | Status: DC

## 2024-06-26 NOTE — Group Note (Signed)
 Date:  06/26/2024 Time:  5:12 PM  Group Topic/Focus:  Goals Group:   The focus of this group is to help patients establish daily goals to achieve during treatment and discuss how the patient can incorporate goal setting into their daily lives to aide in recovery. Orientation:   The focus of this group is to educate the patient on the purpose and policies of crisis stabilization and provide a format to answer questions about their admission.  The group details unit policies and expectations of patients while admitted.    Participation Level:  Active  Participation Quality:  Appropriate  Affect:  Appropriate  Cognitive:  Appropriate  Insight: Appropriate  Engagement in Group:  Engaged  Modes of Intervention:  Discussion    Phillip Anderson 06/26/2024, 5:12 PM

## 2024-06-26 NOTE — Group Note (Signed)
 Recreation Therapy Group Note   Group Topic:Problem Solving  Group Date: 06/26/2024 Start Time: 1015 End Time: 1035 Facilitators: Earlie Schank-McCall, LRT,CTRS Location: [address] Dayroom   Group Topic: Communication, Team Building, Problem Solving  Goal Area(s) Addresses:  Patient will effectively work with peer towards shared goal.  Patient will identify skills used to make activity successful.  Patient will identify how skills used during activity can be used to reach post d/c goals.   Behavioral Response: Engaged  Intervention: STEM Activity  Activity: Straw Bridge. In teams of 3-5, patients were given 15 plastic drinking straws and an equal length of masking tape. Using the materials provided, patients were instructed to build a free standing bridge-like structure to suspend an everyday item (ex: puzzle box) off of the floor or table surface. All materials were required to be used by the team in their design. LRT facilitated post-activity discussion reviewing team process. Patients were encouraged to reflect how the skills used in this activity can be generalized to daily life post discharge.   Education: Pharmacist, community, Scientist, physiological, Discharge Planning   Education Outcome: Acknowledges education/In group clarification offered/Needs additional education.    Affect/Mood: Appropriate   Participation Level: Engaged   Participation Quality: Independent   Behavior: Appropriate   Speech/Thought Process: Focused   Insight: Good   Judgement: Good   Modes of Intervention: STEM Activity   Patient Response to Interventions:  Engaged   Education Outcome:  In group clarification offered    Clinical Observations/Individualized Feedback: Pt was in a better mood and brighter. Pt worked on his own to create his structure. 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 Pt was called out of group by doctor but later returned to complete  his bridge with the assistance of a peer handing the tape as needed. Pt successfully created a bridge that could hold the book.    Plan: Continue to engage patient in RT group sessions 2-3x/week.   Phillip Anderson, LRT,CTRS 06/26/2024 1:21 PM
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
## 2024-06-26 NOTE — Progress Notes (Signed)
  [facility] Adult Case Management Discharge Plan :  Will you be returning to the same living situation after discharge:  No.  Patient will discharge to his friend, Zada Null At discharge, do you have transportation home?: Yes,  Patient's friend, Zada Null, [phone] will pick him up at 3:00 PM Do you have the ability to pay for your medications: Yes,  patient has Medicaid   Release of information consent forms completed and in the chart;  Patient's signature needed at discharge.  Patient to Follow up at:  Follow-up Information     [facility] Go on 06/30/2024.   Specialty: Professional Counselor Why: Please go to this provider on 06/30/24 at 9:00 am for an assessment, to obtain therapy services. You may also go Monday through Friday, from 9 am to 1 pm for an initial assessment for services. Contact information: [address]  [address] [place] [address] [phone]         [facility]. Go on 07/07/2024.   Specialty: Behavioral Health Why: Please go to this provider on 07/07/24 at 7:00 am for an assessment, to obtain medication management services. You may also go Monday through Friday, arrive by 7:00 am for an initial assessment for services. Contact information: [address] [place]  [phone]                Next level of care provider has access to [facility] Link:no  Safety Planning and Suicide Prevention discussed: Yes,  Zada Null (friend) [phone] and Gregery Walberg (mom) [phone]  Has patient been referred to the Quitline?: Patient refused referral for treatment  Patient has been referred for addiction treatment: At admission, patient tested positive for cocaine and fentanyl .  He said that he is not ready to stop using substances at this time due to pain in his legs, however accepted information about substance use resources.     Sherrika Weakland O Kyair Ditommaso, LCSWA 06/26/2024, 1:09 PM

## 2024-06-26 NOTE — Progress Notes (Signed)
 Patient ID: Phillip Anderson, male   DOB: 1981/05/12, 43 y.o.   MRN: [id number] Patient discharged to home/self care in the presence of his mother. Patient denies SI, HI, and AVH upon discharge. Discharge instructions reviewed and patient acknowledged understanding. All belongings returned to patient. Patient successfully discharged.

## 2024-06-26 NOTE — [facility] Suicide Risk Assessment (Signed)
 [facility] INPATIENT:  Family/Significant Other Suicide Prevention Education  Suicide Prevention Education:  Education Completed; Zada Null (friend) [phone],  (name of family member/significant other) has been identified by the patient as the family member/significant other with whom the patient will be residing, and identified as the person(s) who will aid the patient in the event of a mental health crisis (suicidal ideations/suicide attempt).  With written consent from the patient, the family member/significant other has been provided the following suicide prevention education, prior to the and/or following the discharge of the patient.  Patient allowed friend to secure his guns.   Friend said that patient's son brought the guns to him, and they are locked up in a safe.  Friend said that patient will not have access to them.  Friend said that he is available to pick up patient.  The suicide prevention education provided includes the following: Suicide risk factors Suicide prevention and interventions National Suicide Hotline telephone number [facility] assessment telephone number [facility] Emergency Assistance 911 [facility] and/or Residential Mobile Crisis Unit telephone number  Request made of family/significant other to: Remove weapons (e.g., guns, rifles, knives), all items previously/currently identified as safety concern.    The family member/significant other verbalizes understanding of the suicide prevention education information provided.  The family member/significant other agrees to remove the items of safety concern listed above.  Shota Kohrs O Lorenz Donley, LCSWA 06/26/2024, 1:05 PM

## 2024-06-26 NOTE — Plan of Care (Signed)
   Problem: Education: Goal: Emotional status will improve Outcome: Progressing Goal: Mental status will improve Outcome: Progressing Goal: Verbalization of understanding the information provided will improve Outcome: Progressing
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
## 2024-06-26 NOTE — Plan of Care (Signed)
  Problem: Education: Goal: Knowledge of [facility] Education information/materials will improve 06/26/2024 1714 by Jakie Trudi SAUNDERS, RN Outcome: Adequate for Discharge 06/26/2024 1524 by Jakie Trudi SAUNDERS, RN Outcome: Adequate for Discharge Goal: Emotional status will improve 06/26/2024 1714 by Jakie Trudi SAUNDERS, RN Outcome: Adequate for Discharge 06/26/2024 1524 by Jakie Trudi SAUNDERS, RN Outcome: Adequate for Discharge Goal: Mental status will improve 06/26/2024 1714 by Jakie Trudi SAUNDERS, RN Outcome: Adequate for Discharge 06/26/2024 1524 by Jakie Trudi SAUNDERS, RN Outcome: Adequate for Discharge Goal: Verbalization of understanding the information provided will improve 06/26/2024 1714 by Jakie Trudi SAUNDERS, RN Outcome: Adequate for Discharge 06/26/2024 1524 by Jakie Trudi SAUNDERS, RN Outcome: Adequate for Discharge   Problem: Activity: Goal: Interest or engagement in activities will improve 06/26/2024 1714 by Jakie Trudi SAUNDERS, RN Outcome: Adequate for Discharge 06/26/2024 1524 by Jakie Trudi SAUNDERS, RN Outcome: Adequate for Discharge Goal: Sleeping patterns will improve 06/26/2024 1714 by Jakie Trudi SAUNDERS, RN Outcome: Adequate for Discharge 06/26/2024 1524 by Jakie Trudi SAUNDERS, RN Outcome: Adequate for Discharge   Problem: Coping: Goal: Ability to verbalize frustrations and anger appropriately will improve 06/26/2024 1714 by Jakie Trudi SAUNDERS, RN Outcome: Adequate for Discharge 06/26/2024 1524 by Jakie Trudi SAUNDERS, RN Outcome: Adequate for Discharge Goal: Ability to demonstrate self-control will improve 06/26/2024 1714 by Jakie Trudi SAUNDERS, RN Outcome: Adequate for Discharge 06/26/2024 1524 by Jakie Trudi SAUNDERS, RN Outcome: Adequate for Discharge   Problem: Health Behavior/Discharge Planning: Goal: Identification of resources available to assist in meeting health care needs will improve 06/26/2024 1714 by Jakie Trudi SAUNDERS, RN Outcome: Adequate for  Discharge 06/26/2024 1524 by Jakie Trudi SAUNDERS, RN Outcome: Adequate for Discharge Goal: Compliance with treatment plan for underlying cause of condition will improve 06/26/2024 1714 by Jakie Trudi SAUNDERS, RN Outcome: Adequate for Discharge 06/26/2024 1524 by Jakie Trudi SAUNDERS, RN Outcome: Adequate for Discharge   Problem: Physical Regulation: Goal: Ability to maintain clinical measurements within normal limits will improve 06/26/2024 1714 by Jakie Trudi SAUNDERS, RN Outcome: Adequate for Discharge 06/26/2024 1524 by Jakie Trudi SAUNDERS, RN Outcome: Adequate for Discharge   Problem: Safety: Goal: Periods of time without injury will increase 06/26/2024 1714 by Jakie Trudi SAUNDERS, RN Outcome: Adequate for Discharge 06/26/2024 1524 by Jakie Trudi SAUNDERS, RN Outcome: Adequate for Discharge  Patient discharged to home/self care.

## 2024-06-26 NOTE — Plan of Care (Signed)
  Problem: Education: Goal: Knowledge of [place] [facility] Education information/materials will improve Outcome: Adequate for Discharge Goal: Emotional status will improve Outcome: Adequate for Discharge Goal: Mental status will improve Outcome: Adequate for Discharge Goal: Verbalization of understanding the information provided will improve Outcome: Adequate for Discharge   Problem: Activity: Goal: Interest or engagement in activities will improve Outcome: Adequate for Discharge Goal: Sleeping patterns will improve Outcome: Adequate for Discharge   Problem: Coping: Goal: Ability to verbalize frustrations and anger appropriately will improve Outcome: Adequate for Discharge Goal: Ability to demonstrate self-control will improve Outcome: Adequate for Discharge   Problem: Health Behavior/Discharge Planning: Goal: Identification of resources available to assist in meeting health care needs will improve Outcome: Adequate for Discharge Goal: Compliance with treatment plan for underlying cause of condition will improve Outcome: Adequate for Discharge   Problem: Physical Regulation: Goal: Ability to maintain clinical measurements within normal limits will improve Outcome: Adequate for Discharge   Problem: Safety: Goal: Periods of time without injury will increase Outcome: Adequate for Discharge  Care plan complete patient discharged.
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
## 2024-06-26 NOTE — [facility] Group Notes (Signed)
 Spirituality Group   Description: Participant directed exploration of values, beliefs and meaning  **Focus today on gifts & goals: name a life-giving activity, talent, etc. Name a new skill or activity you would like to try. Locate a spiritual aspect (eg, purpose, joy) in both.  Following a brief framework of chaplain's role and ground rules of group behavior, participants are invited to share concerns or questions that engage spiritual life. Emphasis placed on common themes and shared experiences and ways to make meaning and clarify living into one's values.   Theory/Process/Goal: Utilize the theoretical framework of group therapy established by Celena Kite, Relational Cultural Theory and Rogerian approaches to facilitate relational empathy and use of the "here and now" to foster reflection, self-awareness, and sharing.   Observations: Esaul was actively engaged in the group discussion. 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  Hindy Perrault L. Delores HERO.Div

## 2024-06-26 NOTE — [facility] Suicide Risk Assessment (Signed)
 [facility] INPATIENT:  Family/Significant Other Suicide Prevention Education  Suicide Prevention Education:  Education Completed; Burgess Sheriff (mom) [phone],  (name of family member/significant other) has been identified by the patient as the family member/significant other with whom the patient will be residing, and identified as the person(s) who will aid the patient in the event of a mental health crisis (suicidal ideations/suicide attempt).  With written consent from the patient, the family member/significant other has been provided the following suicide prevention education, prior to the and/or following the discharge of the patient.  Mom said that patient can't return to her home upon discharge.  When asked if patient has any guns or weapons, she responded, I have no idea.  Mom believes that patient uses substances, and this led to hospitalization, and needs help.  The suicide prevention education provided includes the following: Suicide risk factors Suicide prevention and interventions National Suicide Hotline telephone number [facility] assessment telephone number [facility] Emergency Assistance 911 [facility] and/or Residential Mobile Crisis Unit telephone number  Request made of family/significant other to: Remove weapons (e.g., guns, rifles, knives), all items previously/currently identified as safety concern.    The family member/significant other verbalizes understanding of the suicide prevention education information provided.  The family member/significant other agrees to remove the items of safety concern listed above.  Ethaniel Garfield O Zeshan Sena, LCSWA 06/26/2024, 1:02 PM

## 2024-07-03 DIAGNOSIS — R079 Chest pain, unspecified: Secondary | ICD-10-CM | POA: Diagnosis not present

## 2024-07-03 DIAGNOSIS — G4489 Other headache syndrome: Secondary | ICD-10-CM | POA: Diagnosis not present

## 2024-07-13 ENCOUNTER — Ambulatory Visit (HOSPITAL_COMMUNITY): Admitting: Physician Assistant

## 2024-08-26 ENCOUNTER — Ambulatory Visit (HOSPITAL_COMMUNITY): Admitting: Licensed Clinical Social Worker

## 2024-09-09 ENCOUNTER — Ambulatory Visit: Attending: Internal Medicine | Admitting: Internal Medicine

## 2024-09-09 NOTE — Progress Notes (Deleted)
  Cardiology Office Note:  .   Date:  09/09/2024  ID:  Phillip Anderson, DOB July 09, 1981, MRN [id number] PCP: Patient, No Pcp Per  [facility] Providers Cardiologist:  None { Click to update primary MD,subspecialty MD or APP then REFRESH:1}    History of Present Illness: .   Phillip Anderson is a 43 y.o. male with history of psychosis requiring involuntary commitment as recently as August of this year, who presents for evaluation of chest pain and shortness of breath.  ROS: See HPI  Studies Reviewed: .        *** Risk Assessment/Calculations:   {Does this patient have ATRIAL FIBRILLATION?:[id number]} No BP recorded.  {Refresh Note OR Click here to enter BP  :1}***       Physical Exam:   VS:  There were no vitals taken for this visit.   Wt Readings from Last 3 Encounters:  06/22/24 240 lb 1.3 oz (108.9 kg)  02/24/24 240 lb (108.9 kg)  05/22/23 240 lb (108.9 kg)    General:  NAD. Neck: No JVD or HJR. Lungs: Clear to auscultation bilaterally without wheezes or crackles. Heart: Regular rate and rhythm without murmurs, rubs, or gallops. Abdomen: Soft, nontender, nondistended. Extremities: No lower extremity edema.  ASSESSMENT AND PLAN: .    ***    {Are you ordering a CV Procedure (e.g. stress test, cath, DCCV, TEE, etc)?   Press F2        :[id number]}  Dispo: ***  Signed, Lonni Hanson, MD

## 2024-09-10 ENCOUNTER — Encounter: Payer: Self-pay | Admitting: Internal Medicine

## 2024-09-29 ENCOUNTER — Ambulatory Visit: Admitting: Cardiology
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
## 2024-11-27 ENCOUNTER — Emergency Department (HOSPITAL_COMMUNITY)
Admission: EM | Admit: 2024-11-27 | Discharge: 2024-11-28 | Disposition: A | Attending: Emergency Medicine | Admitting: Emergency Medicine

## 2024-11-27 ENCOUNTER — Emergency Department (HOSPITAL_COMMUNITY)

## 2024-11-27 DIAGNOSIS — F23 Brief psychotic disorder: Secondary | ICD-10-CM | POA: Diagnosis present

## 2024-11-27 DIAGNOSIS — F1994 Other psychoactive substance use, unspecified with psychoactive substance-induced mood disorder: Secondary | ICD-10-CM | POA: Diagnosis present

## 2024-11-27 DIAGNOSIS — R079 Chest pain, unspecified: Secondary | ICD-10-CM | POA: Insufficient documentation

## 2024-11-27 DIAGNOSIS — F29 Unspecified psychosis not due to a substance or known physiological condition: Secondary | ICD-10-CM | POA: Insufficient documentation

## 2024-11-27 DIAGNOSIS — F22 Delusional disorders: Secondary | ICD-10-CM | POA: Insufficient documentation

## 2024-11-27 DIAGNOSIS — F1910 Other psychoactive substance abuse, uncomplicated: Secondary | ICD-10-CM | POA: Insufficient documentation

## 2024-11-27 DIAGNOSIS — Z79899 Other long term (current) drug therapy: Secondary | ICD-10-CM | POA: Insufficient documentation

## 2024-11-27 LAB — HEPATIC FUNCTION PANEL
ALT: 6 U/L (ref 0–44)
AST: 13 U/L — ABNORMAL LOW (ref 15–41)
Albumin: 4.1 g/dL (ref 3.5–5.0)
Alkaline Phosphatase: 66 U/L (ref 38–126)
Bilirubin, Direct: 0.2 mg/dL (ref 0.0–0.2)
Indirect Bilirubin: 0.2 mg/dL — ABNORMAL LOW (ref 0.3–0.9)
Total Bilirubin: 0.4 mg/dL (ref 0.0–1.2)
Total Protein: 6.9 g/dL (ref 6.5–8.1)

## 2024-11-27 LAB — BASIC METABOLIC PANEL WITH GFR
Anion gap: 9 (ref 5–15)
BUN: 12 mg/dL (ref 6–20)
CO2: 29 mmol/L (ref 22–32)
Calcium: 9.2 mg/dL (ref 8.9–10.3)
Chloride: 103 mmol/L (ref 98–111)
Creatinine, Ser: 1.1 mg/dL (ref 0.61–1.24)
GFR, Estimated: >60 mL/min
Glucose, Bld: 93 mg/dL (ref 70–99)
Potassium: 4.4 mmol/L (ref 3.5–5.1)
Sodium: 140 mmol/L (ref 135–145)

## 2024-11-27 LAB — CBC
HCT: 47.8 % (ref 39.0–52.0)
Hemoglobin: 15.4 g/dL (ref 13.0–17.0)
MCH: 31.2 pg (ref 26.0–34.0)
MCHC: 32.2 g/dL (ref 30.0–36.0)
MCV: 96.8 fL (ref 80.0–100.0)
Platelets: 191 10*3/uL (ref 150–400)
RBC: 4.94 MIL/uL (ref 4.22–5.81)
RDW: 12.1 % (ref 11.5–15.5)
WBC: 4.5 10*3/uL (ref 4.0–10.5)
nRBC: 0 % (ref 0.0–0.2)

## 2024-11-27 LAB — ETHANOL: Alcohol, Ethyl (B): <15 mg/dL

## 2024-11-27 LAB — LIPASE, BLOOD: Lipase: 14 U/L (ref 11–51)

## 2024-11-27 LAB — TROPONIN T, HIGH SENSITIVITY
Troponin T High Sensitivity: 9 ng/L (ref 0–19)
Troponin T High Sensitivity: 6 ng/L (ref 0–19)

## 2024-11-27 MED ORDER — ZIPRASIDONE MESYLATE 20 MG IM SOLR
20.0000 mg | Freq: Once | INTRAMUSCULAR | Status: AC
  Administered 2024-11-27: 20 mg via INTRAMUSCULAR
  Filled 2024-11-27: qty 20

## 2024-11-27 MED ORDER — OLANZAPINE 10 MG PO TBDP
20.0000 mg | ORAL_TABLET | Freq: Once | ORAL | Status: DC
  Filled 2024-11-27: qty 2

## 2024-11-27 MED ORDER — STERILE WATER FOR INJECTION IJ SOLN
INTRAMUSCULAR | Status: AC
  Filled 2024-11-27: qty 10
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
## 2024-11-27 NOTE — ED Notes (Signed)
 Pt finished his TTS session and is completely out of his restraints and is calm. Provided the patient with a turkey sandwich and ginger ale .
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
## 2024-11-27 NOTE — ED Notes (Signed)
Pt refused 2nd troponin.

## 2024-11-27 NOTE — BH Assessment (Incomplete)
 Comprehensive Clinical Assessment (CCA) Note  11/27/2024 VETO MACQUEEN [id number]  Chief Complaint:  Chief Complaint  Patient presents with   IVC  Disposition: Per Kathryne Ajibola,NP patient is recommended for overnight observation with re-evaluation in the morning.    The patient demonstrates the following risk factors for suicide: Chronic risk factors for suicide include: {Chronic Risk Factors for Dlprpiz:69585988}. Acute risk factors for suicide include: {Acute Risk Factors for Dlprpiz:69585987}. Protective factors for this patient include: {Protective Factors for Suicide Mpdx:69585986}. Considering these factors, the overall suicide risk at this point appears to be {Desc; low/moderate/high:110033}. Patient {ACTION; IS/IS WNU:78978602} appropriate for outpatient follow up.   Patient is a *** year old {Desc; male/male:11659} with a history of {Diagnoses; anxiety/depression:15361} who presents {Voluntary?:60373} to [facility] for an assessment. Patient resides in the home with *** and identifies*** as their primary support system.Patient reports isolation, crying spells, irritability, hopelessness, guilt, loss of interest to do things they enjoy, fatigue, lack of concentration, worthlessness, change in sleep, change in appetite. Patient reports history of past suicide attempts, last occurrence was ***.  Patient has a hx of Substance Abuse:  Last use was *** .Patient {Actions; denies-reports:120008} NSSIB, SI, HI, AVH.  Patient identifies {his/her/their:21314} primary stressors as ***. Patient reports a family hx of ***{Diagnoses; anxiety/depression:15361}. Patient {Actions; denies-reports:120008} history of abuse or trauma. Patient {Actions; denies-reports:120008} current legal problems. Patient {ACTION; IS/IS WNU:78978602} receiving outpatient therapy and psychiatry services, with ***. Patient reports {he/she/they:23295} takes {his/her/their:21314} medications as prescribed (see  MAR) and {Actions; denies-reports:120008} recent medication changes. Patient {Actions; denies-reports:120008} previous inpatient admission at ***for *** in ***.  Patient {Actions; denies-reports:120008} access to weapons.   Patient is {ACTIONS; CAN/NOT:18746} to contract for safety outside of the hospital.  Patient gives verbal consent for [facility] to contact ***.  Per {FAMILY FZFAZMD:80002} patient is ***.   Treatment options were discussed and patient is in agreement with recommendation for ***.   During evaluation *** is in no acute distress. {He/she (caps):30048} is alert, oriented x 4, calm, cooperative and attentive. {his/her/their:21314} mood is {THERAPIES; PSYCH MOOD/AFFECT:23994} with congruent affect. {He/she (caps):30048} has normal speech, and behavior.  Objectively there is no evidence of psychosis/mania or delusional thinking.  Patient is able to converse coherently, goal directed thoughts, no distractibility, or pre-occupation.   {He/she (caps):30048} also denies suicidal/self-harm/homicidal ideation, psychosis, and paranoia.  Patient answered question appropriately.      Visit Diagnosis: ***    CCA Screening, Triage and Referral (STR)  Patient Reported Information How did you hear about us ? No data recorded What Is the Reason for Your Visit/Call Today? No data recorded How Long Has This Been Causing You Problems? No data recorded What Do You Feel Would Help You the Most Today? No data recorded  Have You Recently Had Any Thoughts About Hurting Yourself? No data recorded Are You Planning to Commit Suicide/Harm Yourself At This time? No data recorded  Flowsheet Row ED from 11/27/2024 in [facility] Emergency Department at [facility] Admission (Discharged) from 06/25/2024 in [facility] INPATIENT ADULT [facility]B ED from 06/22/2024 in [facility] Emergency Department at [facility]  C-SSRS RISK CATEGORY No Risk No Risk No Risk    Have you Recently Had  Thoughts About Hurting Someone [name]? No data recorded Are You Planning to Harm Someone at This Time? No data recorded Explanation: No data recorded  Have You Used Any Alcohol or Drugs in the Past 24 Hours? No data recorded How Long Ago Did You  Use Drugs or Alcohol? No data recorded What Did You Use and How Much? No data recorded  Do You Currently Have a Therapist/Psychiatrist? No data recorded Name of Therapist/Psychiatrist:    Have You Been Recently Discharged From Any Office Practice or Programs? No data recorded Explanation of Discharge From Practice/Program: No data recorded    CCA Screening Triage Referral Assessment Type of Contact: No data recorded Telemedicine Service Delivery:   Is this Initial or Reassessment?   Date Telepsych consult ordered in [facility]:    Time Telepsych consult ordered in [facility]:    Location of Assessment: No data recorded Provider Location: No data recorded  Collateral Involvement: No data recorded  Does Patient Have a Court Appointed Legal Guardian? No  Legal Guardian Contact Information: No data recorded Copy of Legal Guardianship Form: No data recorded Legal Guardian Notified of Arrival: No data recorded Legal Guardian Notified of Pending Discharge: No data recorded If Minor and Not Living with Parent(s), Who has Custody? No data recorded Is CPS involved or ever been involved? No data recorded Is APS involved or ever been involved? No data recorded  Patient Determined To Be At Risk for Harm To Self or Others Based on Review of Patient Reported Information or Presenting Complaint? No data recorded Method: No data recorded Availability of Means: No data recorded Intent: No data recorded Notification Required: No data recorded Additional Information for Danger to Others Potential: No data recorded Additional Comments for Danger to Others Potential: No data recorded Are There Guns or Other Weapons in Your Home? No data recorded Types of Guns/Weapons:  No data recorded Are These Weapons Safely Secured?                            No data recorded Who Could Verify You Are Able To Have These Secured: No data recorded Do You Have any Outstanding Charges, Pending Court Dates, Parole/Probation? No data recorded Contacted To Inform of Risk of Harm To Self or Others: No data recorded   Does Patient Present under Involuntary Commitment? No data recorded   [place] of Residence: No data recorded  Patient Currently Receiving the Following Services: No data recorded  Determination of Need: No data recorded  Options For Referral: No data recorded    CCA Biopsychosocial Patient Reported Schizophrenia/Schizoaffective Diagnosis in Past: No   Strengths: No data recorded  Mental Health Symptoms Depression:  No data recorded  Duration of Depressive symptoms:    Mania:  No data recorded  Anxiety:   No data recorded  Psychosis:  No data recorded  Duration of Psychotic symptoms:    Trauma:  No data recorded  Obsessions:  No data recorded  Compulsions:  No data recorded  Inattention:  No data recorded  Hyperactivity/Impulsivity:  No data recorded  Oppositional/Defiant Behaviors:  No data recorded  Emotional Irregularity:  No data recorded  Other Mood/Personality Symptoms:  No data recorded   Mental Status Exam Appearance and self-care  Stature:  No data recorded  Weight:  No data recorded  Clothing:  No data recorded  Grooming:  No data recorded  Cosmetic use:  No data recorded  Posture/gait:  No data recorded  Motor activity:  No data recorded  Sensorium  Attention:  No data recorded  Concentration:  No data recorded  Orientation:  No data recorded  Recall/memory:  No data recorded  Affect and Mood  Affect:  No data recorded  Mood:  No data recorded  Relating  Eye contact:  No data recorded  Facial expression:  No data recorded  Attitude toward examiner:  No data recorded  Thought and Language  Speech flow: No data recorded   Thought content:  No data recorded  Preoccupation:  No data recorded  Hallucinations:  No data recorded  Organization:  No data recorded  [employer] of Knowledge:  No data recorded  Intelligence:  No data recorded  Abstraction:  No data recorded  Judgement:  No data recorded  Reality Testing:  No data recorded  Insight:  No data recorded  Decision Making:  No data recorded  Social Functioning  Social Maturity:  No data recorded  Social Judgement:  No data recorded  Stress  Stressors:  No data recorded  Coping Ability:  No data recorded  Skill Deficits:  No data recorded  Supports:  No data recorded    Religion:    Leisure/Recreation:    Exercise/Diet:     CCA Employment/Education Employment/Work Situation:    Education:     CCA Family/Childhood History Family and Relationship History:    Childhood History:          CCA Substance Use Alcohol/Drug Use:                           ASAM's:  Six Dimensions of Multidimensional Assessment  Dimension 1:  Acute Intoxication and/or Withdrawal Potential:      Dimension 2:  Biomedical Conditions and Complications:      Dimension 3:  Emotional, Behavioral, or Cognitive Conditions and Complications:     Dimension 4:  Readiness to Change:     Dimension 5:  Relapse, Continued use, or Continued Problem Potential:     Dimension 6:  Recovery/Living Environment:     ASAM Severity Score:    ASAM Recommended Level of Treatment:     Substance use Disorder (SUD)    Recommendations for Services/Supports/Treatments:    Disposition Recommendation per psychiatric provider: {CHLmaccldispo:31820}   DSM5 Diagnoses: Patient Active Problem List   Diagnosis Date Noted   Substance induced mood disorder (HCC) 06/26/2024   Acute psychosis (HCC) 06/25/2024   Psychosis (HCC) 06/24/2024   Extensor tendon laceration of left forearm with open wound    Laceration of right forearm 05/11/2022    Chest pain 02/26/2022   Abnormal ECG 02/26/2022   Essential hypertension 02/26/2022   Pulmonary nodule 02/26/2022   Polysubstance dependence including opioid type drug, episodic abuse (HCC) 03/25/2012   TENDINITIS, SHOULDER, LEFT 09/14/2008   TOBACCO ABUSE 12/19/2007   CHEST WALL PAIN, ACUTE 12/11/2007   BACK PAIN 09/30/2007     Referrals to Alternative Service(s): Referred to Alternative Service(s):   Place:   Date:   Time:    Referred to Alternative Service(s):   Place:   Date:   Time:    Referred to Alternative Service(s):   Place:   Date:   Time:    Referred to Alternative Service(s):   Place:   Date:   Time:     Maico Mulvehill C Cythia Bachtel, LCMHCA

## 2024-11-27 NOTE — ED Notes (Signed)
 Pt asking to go to his car and lock it up. This RN asked pt if security could lock his vehicle because he cannot go outside. Pt asking why? This RN informed pt that he has been involuntarily committed by the provider. Pt stating that he is not staying and walking around the department looking for the exit. This RN and a NT trying to get the pt back in his room. Pt left out of ED with security and [employer] behind him.
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
## 2024-11-27 NOTE — ED Notes (Signed)
 Patient refused 2nd Troponin

## 2024-11-28 ENCOUNTER — Other Ambulatory Visit: Payer: Self-pay

## 2024-11-28 ENCOUNTER — Inpatient Hospital Stay (HOSPITAL_COMMUNITY)
Admission: AD | Admit: 2024-11-28 | Discharge: 2024-11-30 | DRG: 885 | Disposition: A | Source: Intra-hospital | Attending: Student in an Organized Health Care Education/Training Program | Admitting: Psychiatry

## 2024-11-28 ENCOUNTER — Encounter (HOSPITAL_COMMUNITY): Payer: Self-pay | Admitting: Psychiatry

## 2024-11-28 DIAGNOSIS — Z560 Unemployment, unspecified: Secondary | ICD-10-CM | POA: Diagnosis not present

## 2024-11-28 DIAGNOSIS — F17210 Nicotine dependence, cigarettes, uncomplicated: Secondary | ICD-10-CM | POA: Diagnosis present

## 2024-11-28 DIAGNOSIS — Z91148 Patient's other noncompliance with medication regimen for other reason: Secondary | ICD-10-CM

## 2024-11-28 DIAGNOSIS — F1110 Opioid abuse, uncomplicated: Secondary | ICD-10-CM | POA: Diagnosis present

## 2024-11-28 DIAGNOSIS — F32A Depression, unspecified: Secondary | ICD-10-CM | POA: Diagnosis present

## 2024-11-28 DIAGNOSIS — F23 Brief psychotic disorder: Principal | ICD-10-CM | POA: Diagnosis present

## 2024-11-28 DIAGNOSIS — Z79899 Other long term (current) drug therapy: Secondary | ICD-10-CM

## 2024-11-28 DIAGNOSIS — Z91030 Bee allergy status: Secondary | ICD-10-CM | POA: Diagnosis not present

## 2024-11-28 DIAGNOSIS — R5381 Other malaise: Secondary | ICD-10-CM | POA: Diagnosis present

## 2024-11-28 DIAGNOSIS — F1410 Cocaine abuse, uncomplicated: Secondary | ICD-10-CM | POA: Diagnosis present

## 2024-11-28 DIAGNOSIS — I10 Essential (primary) hypertension: Secondary | ICD-10-CM | POA: Diagnosis present

## 2024-11-28 LAB — URINE DRUG SCREEN
Amphetamines: NEGATIVE
Barbiturates: NEGATIVE
Benzodiazepines: NEGATIVE
Cocaine: POSITIVE — AB
Fentanyl: POSITIVE — AB
Methadone Scn, Ur: NEGATIVE
Opiates: NEGATIVE
Tetrahydrocannabinol: NEGATIVE

## 2024-11-28 LAB — URINALYSIS, ROUTINE W REFLEX MICROSCOPIC
Bilirubin Urine: NEGATIVE
Glucose, UA: NEGATIVE mg/dL
Hgb urine dipstick: NEGATIVE
Ketones, ur: NEGATIVE mg/dL
Leukocytes,Ua: NEGATIVE
Nitrite: NEGATIVE
Protein, ur: NEGATIVE mg/dL
Specific Gravity, Urine: 1.023 (ref 1.005–1.030)
pH: 7 (ref 5.0–8.0)

## 2024-11-28 MED ORDER — LORAZEPAM 2 MG/ML IJ SOLN: 2.0000 mg | Freq: Three times a day (TID) | INTRAMUSCULAR | Status: DC | PRN

## 2024-11-28 MED ORDER — DIPHENHYDRAMINE HCL 50 MG/ML IJ SOLN: 50.0000 mg | Freq: Three times a day (TID) | INTRAMUSCULAR | Status: DC | PRN

## 2024-11-28 MED ORDER — ACETAMINOPHEN 325 MG PO TABS
650.0000 mg | ORAL_TABLET | Freq: Four times a day (QID) | ORAL | Status: DC | PRN
  Administered 2024-11-28: 650 mg via ORAL
  Filled 2024-11-28: qty 2

## 2024-11-28 MED ORDER — IBUPROFEN 400 MG PO TABS
400.0000 mg | ORAL_TABLET | Freq: Four times a day (QID) | ORAL | Status: DC | PRN
  Administered 2024-11-28: 400 mg via ORAL
  Filled 2024-11-28 (×2): qty 1

## 2024-11-28 MED ORDER — HALOPERIDOL LACTATE 5 MG/ML IJ SOLN: 5.0000 mg | Freq: Three times a day (TID) | INTRAMUSCULAR | Status: DC | PRN

## 2024-11-28 MED ORDER — OLANZAPINE 5 MG PO TBDP
5.0000 mg | ORAL_TABLET | ORAL | Status: DC
  Filled 2024-11-28: qty 1

## 2024-11-28 MED ORDER — HALOPERIDOL LACTATE 5 MG/ML IJ SOLN: 10.0000 mg | Freq: Three times a day (TID) | INTRAMUSCULAR | Status: DC | PRN

## 2024-11-28 MED ORDER — HALOPERIDOL LACTATE 5 MG/ML IJ SOLN
5.0000 mg | Freq: Four times a day (QID) | INTRAMUSCULAR | Status: DC | PRN
  Filled 2024-11-28: qty 1

## 2024-11-28 MED ORDER — LORAZEPAM 2 MG/ML IJ SOLN
2.0000 mg | Freq: Four times a day (QID) | INTRAMUSCULAR | Status: DC | PRN
  Filled 2024-11-28: qty 1

## 2024-11-28 MED ORDER — MAGNESIUM HYDROXIDE 400 MG/5ML PO SUSP: 30.0000 mL | Freq: Every day | ORAL | Status: DC | PRN

## 2024-11-28 MED ORDER — ACETAMINOPHEN 325 MG PO TABS: 650.0000 mg | ORAL_TABLET | ORAL | Status: DC | PRN

## 2024-11-28 MED ORDER — DIPHENHYDRAMINE HCL 25 MG PO CAPS: 50.0000 mg | ORAL_CAPSULE | Freq: Three times a day (TID) | ORAL | Status: DC | PRN

## 2024-11-28 MED ORDER — OLANZAPINE 10 MG PO TABS: 10.0000 mg | ORAL_TABLET | Freq: Every day | ORAL | Status: DC

## 2024-11-28 MED ORDER — NICOTINE 14 MG/24HR TD PT24
14.0000 mg | MEDICATED_PATCH | Freq: Every day | TRANSDERMAL | Status: DC
  Administered 2024-11-29: 14 mg via TRANSDERMAL
  Filled 2024-11-28: qty 1

## 2024-11-28 MED ORDER — NICOTINE 14 MG/24HR TD PT24
14.0000 mg | MEDICATED_PATCH | Freq: Every day | TRANSDERMAL | Status: DC
  Administered 2024-11-28: 14 mg via TRANSDERMAL
  Filled 2024-11-28: qty 1

## 2024-11-28 MED ORDER — HALOPERIDOL 5 MG PO TABS: 5.0000 mg | ORAL_TABLET | Freq: Three times a day (TID) | ORAL | Status: DC | PRN

## 2024-11-28 MED ORDER — OLANZAPINE 10 MG PO TABS
10.0000 mg | ORAL_TABLET | Freq: Every day | ORAL | Status: DC
  Administered 2024-11-28: 10 mg via ORAL
  Filled 2024-11-28: qty 1

## 2024-11-28 MED ORDER — ALUM & MAG HYDROXIDE-SIMETH 200-200-20 MG/5ML PO SUSP: 30.0000 mL | ORAL | Status: DC | PRN

## 2024-11-28 NOTE — ED Notes (Signed)
 Pt transferred to [facility]. Pt was dressed out and wanded. Two bags, labeled with his name placed in locker 39.

## 2024-11-28 NOTE — Plan of Care (Signed)
   Problem: Education: Goal: Emotional status will improve Outcome: Progressing Goal: Mental status will improve Outcome: Progressing Goal: Verbalization of understanding the information provided will improve Outcome: Progressing

## 2024-11-28 NOTE — BHH Group Notes (Signed)
 Adult Psychoeducational Group Note  Date:  11/28/2024 Time:  4:20 PM  Group Topic/Focus:  Wellness Toolbox:   The focus of this group is to discuss various aspects of wellness, balancing those aspects and exploring ways to increase the ability to experience wellness.  Patients will create a wellness toolbox for use upon discharge.  Participation Level:  Did Not Attend  Participation Quality:    Affect:    Cognitive:    Insight:   Engagement in Group:    Modes of Intervention:    Additional Comments:    Annett Berle Hoyer 11/28/2024, 4:20 PM

## 2024-11-28 NOTE — Progress Notes (Addendum)
 MHT during room check smelled tobacco in patient room.  MHT looked around an seen 3/4 pack of cigarettes on [facility] unit. Chief of staff notified.

## 2024-11-28 NOTE — Progress Notes (Signed)
 BHH/BMU LCSW Progress Note   11/28/2024    10:52 AM  Phillip Anderson   [id number]   Type of Contact and Topic:  Psychiatric Bed Placement   Pt accepted to [facility] 505-1   Patient meets inpatient criteria per Kathryne Show, NP    The attending provider will be Dr. Raliegh  Call report to [phone]    Ester Sloop, Paramedic @ [facility] ED notified.     Pt scheduled  to arrive at [facility].    Bunnie Gallop, MSW, LCSW-A  10:54 AM 11/28/2024

## 2024-11-28 NOTE — Group Note (Signed)
 Date:  11/28/2024 Time:  8:48 PM  Group Topic/Focus:  Wrap-Up Group:   The focus of this group is to help patients review their daily goal of treatment and discuss progress on daily workbooks.    Participation Level:  Did Not Attend  Keirah Konitzer Dacosta 11/28/2024, 8:48 PM
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
## 2024-11-28 NOTE — ED Provider Notes (Signed)
" °  [place] EMERGENCY DEPARTMENT AT [facility] Emergency Medicine Observation Re-evaluation Note  Phillip Anderson is a 44 y.o. male, seen on rounds today.  Pt initially presented on 11/27/24 at 1405 to the ED for complaints of  Chief Complaint  Patient presents with   IVC   PMHx: Polysubstance use disorder, hypertension Currently, the patient is resting quietly in bed.  Physical Exam  BP (!) 139/43   Pulse 81   Temp 98.7 F (37.1 C) (Oral)   Resp 20   SpO2 100%  Physical Exam General: NAD Lungs: Normal effort Psych: Currently calm  ED Course / MDM  EKG:EKG Interpretation Date/Time:  Friday November 27 2024 15:05:29 EST Ventricular Rate:  92 PR Interval:  159 QRS Duration:  86 QT Interval:  329 QTC Calculation: 407 R Axis:   20  Text Interpretation: Sinus rhythm Abnormal R-wave progression, early transition Baseline wander in lead(s) V1 no sig change from previouis Confirmed by Armenta Canning [phone]) on 11/27/2024 8:27:05 PM  I have reviewed the labs performed to date as well as medications administered while in observation.  Recent changes in the last 24 hours include patient evaluated by psychiatry this morning and recommends inpatient hospitalization. Home medications: Reordered Diet: Reordered by prior team  Plan  Current plan is for awaiting psychiatric placement.   [name], MD 11/28/24 1140  "

## 2024-11-28 NOTE — Tx Team (Signed)
 Initial Treatment Plan 11/28/2024 4:42 PM Phillip Anderson FMW:[id number]    PATIENT STRESSORS: Other: UTA     PATIENT STRENGTHS: Other: UTA   PATIENT IDENTIFIED PROBLEMS: He believes he doesn't need to be here                     DISCHARGE CRITERIA:  Ability to meet basic life and health needs  PRELIMINARY DISCHARGE PLAN:    PATIENT/FAMILY INVOLVEMENT: This treatment plan has been presented to and reviewed with the patient, Phillip Anderson, and/or family member, .  The patient and family have been given the opportunity to ask questions and make suggestions.  Elouise Wolm Morel, RN 11/28/2024, 4:42 PM

## 2024-11-28 NOTE — Progress Notes (Addendum)
 Notified by staff about contraband found in pt's room. Contraband was confiscated and secured in pt's belongings locker. Pt verbalized that he had the cigarettes in his shirt pocket the entire time. Pt's room was searched and no other contraband was found. Pt remains safe on the unit. Primary nurse aware and AC was notified.

## 2024-11-28 NOTE — Progress Notes (Signed)
 Admission Note: Received patient and he was irritable stating  I just went to the ED to get my heart checked out and they brought me over here! Patient has previous admission here at [facility].  He denies SI, HI and AVH. He would not sign any consent forms. Skin assessment completed. Patient c/o of chronic pain ranging from his shoulder, back, and legs. UDS positive for cocaine and fentanyl . Nurse tried to orient patient to unit and treatment/facilities rule and he said'  I been here and know how it goes He was walked to unit and shown his room.

## 2024-11-28 NOTE — BHH Group Notes (Signed)
 Adult Psychoeducational Group Note  Date:  11/28/2024 Time:  4:19 PM  Group Topic/Focus:  Wellness Toolbox:   The focus of this group is to discuss various aspects of wellness, balancing those aspects and exploring ways to increase the ability to experience wellness.  Patients will create a wellness toolbox for use upon discharge.  Participation Level:  Did Not Attend  Participation Quality:    Affect:    Cognitive:    Insight:   Engagement in Group:    Modes of Intervention:    Additional Comments:    Annett Berle Hoyer 11/28/2024, 4:19 PM

## 2024-11-29 MED ORDER — LIDOCAINE 5 % EX PTCH
2.0000 | MEDICATED_PATCH | CUTANEOUS | Status: DC
  Administered 2024-11-29: 2 via TRANSDERMAL
  Filled 2024-11-29: qty 2
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
## 2024-11-29 NOTE — H&P (Signed)
 Psychiatric Admission Assessment Adult  Patient Identification: Phillip Anderson MRN:  [id number] Date of Evaluation:  11/29/2024 Chief Complaint:  Brief psychotic disorder [facility]) [F23] Principal Diagnosis: Brief psychotic disorder (HCC) Diagnosis:  Principal Problem:   Brief psychotic disorder (HCC)    CC: I need to go get my dog  Phillip Anderson is a 44 y.o. male  with a past psychiatric history of polysubstance use (cocaine, fentanyl , THC), prior psychiatric admission to [facility] on 05/2024 for substance-induced psychosis. Patient initially arrived to [facility] on 11/27/2024 for acute psychosis in setting of active substance use and medication nonadherence, and admitted to [facility] under IVC on 11/28/2024  for substance related issues.  UDS on 1/31 was positive for fentanyl  and cocaine.  PMHx is significant for hypertension, left shoulder tendinitis and back pain.    HPI:  Patient reports he is currently in an irritable mood and believes he is hospitalized due to a misunderstanding. He reports he initially presented to [facility] for chest pain and was stopped by staff when he attempted to leave the facility to retrieve his car, noting that he had arrived by car and had left his dog inside.  Patient reports that because he was involuntarily committed, his dog was taken to the pound, which he describes as a major source of stress and urgency, stating he wants to leave the hospital today to retrieve his dog.  Patient reports a history of prescribed fentanyl  patches for burn-related pain on his legs and states he does not use fentanyl  illicitly. He describes using cocaine approximately once to twice per month, characterizing the amount as minimal, and reports he believes this explains the positive urine drug screen given recent use. Patient reports he does not believe his cocaine use contributed to his chest pain and states he does not feel that cocaine use leads to paranoid ideation.  Patient reports he denies suicidal  ideation, homicidal ideation, and auditory or visual hallucinations. He describes psychosocial stressors including plans to move to a different residence and reports having a distant relationship with his mother.  Provides consent for me to contact friend for collateral:  Ulla [phone] - no answer, unable to leave a voicemail  Psychiatric ROS Mood Symptoms Denies any symptoms. Negative screening.  Manic Symptoms Denies any symptoms. Negative screening.  Anxiety Symptoms Denies any symptoms. Negative screening.  Psychosis Symptoms Denies any symptoms. Negative screening.  ____________________________________________________________________ History obtained from Consult note (see note from [facility] on 11/28/2024 ), reviewed and confirmed with patient  Psychiatric History:  Information collected from patient, patient mother and chart review   Information collected from patient and chart review   Prev Dx/Sx: polysubstance abuse Current Psych Provider: none Home Meds (current): none Previous Med Trials: none Therapy: none   Prior Psych Hospitalization: 2013 for polysubstance abuse Prior Self Harm: unknown Prior Violence: unknown   Family Psych History: unknown Family Hx suicide: unknown   Social History:  Developmental Hx: WNL Educational Hx: did not graduate high school Occupational Hx: Engineer, Materials Hx: unknown Living Situation: Lives with Mom Spiritual Hx: unknown Access to weapons/lethal means: unknown    Substance History Alcohol: yes  Type of alcohol UTA Last Drink UTA Number of drinks per day UTA History of alcohol withdrawal seizures UTA History of DT's UTA Tobacco: yes Illicit drugs: cocaine and THC Prescription drug abuse: denies Rehab hx: unknown    Access to firearms: Denies   Total Time spent with patient: 1.5 hours   Columbia Scale:  [employer]  [facility] from 11/27/2024 in [facility] Department [facility] Admission (Discharged) from [facility]  C-SSRS RISK CATEGORY No Risk No Risk No Risk     Tobacco Screening:  Tobacco Use History[1]  BH Tobacco Counseling     Are you interested in Tobacco Cessation Medications?  No, patient refused Counseled patient on smoking cessation:  Refused/Declined practical counseling Reason Tobacco Screening Not Completed: Patient Refused Screening       Social History:  Social History   Substance and Sexual Activity  Alcohol Use Not Currently   Alcohol/week: 2.0 standard drinks of alcohol   Types: 2 Cans of beer per week     Social History   Substance and Sexual Activity  Drug Use Yes   Types: Cocaine, Marijuana, Fentanyl    Comment: last cocaine last week, last marijuana a week or 2      Allergies:  Allergies[2] Lab Results:  Results for orders placed or performed during the hospital encounter of 11/27/24 (from the past 48 hours)  Troponin T, High Sensitivity     Status: None   Collection Time: 11/27/24 10:06 PM  Result Value Ref Range   Troponin T High Sensitivity 6 0 - 19 ng/L    Comment: (NOTE) Biotin concentrations > 1000 ng/mL falsely decrease TnT results.  Serial cardiac troponin measurements are suggested.  Refer to the Links section for chest pain algorithms and additional  guidance. Performed at [facility], [address], [place], [place] [address]   Ethanol     Status: None   Collection Time: 11/27/24 10:06 PM  Result Value Ref Range   Alcohol, Ethyl (B) <15 <15 mg/dL    Comment: (NOTE) For medical purposes only. Performed at [facility], [address] [place], [place] [address]   Lipase, blood     Status: None   Collection Time: 11/27/24 10:06 PM  Result Value Ref Range   Lipase 14 11 - 51 U/L    Comment: Performed at [facility], [address], [place], [place] [address]  Hepatic function panel     Status: Abnormal   Collection Time: 11/27/24 10:06 PM  Result Value Ref Range   Total Protein 6.9 6.5 - 8.1 g/dL   Albumin 4.1 3.5 - 5.0 g/dL   AST 13 (L) 15 - 41 U/L   ALT 6 0 - 44 U/L   Alkaline Phosphatase 66 38 - 126 U/L   Total Bilirubin 0.4 0.0 - 1.2 mg/dL   Bilirubin, Direct 0.2 0.0 - 0.2 mg/dL   Indirect Bilirubin 0.2 (L) 0.3 - 0.9 mg/dL    Comment: Performed at [facility], [address] [place], [place] [address]  Urine rapid drug screen (hosp performed)     Status: Abnormal   Collection Time: 11/28/24 12:33 PM  Result Value Ref Range   Opiates NEGATIVE NEGATIVE   Cocaine POSITIVE (A) NEGATIVE   Benzodiazepines NEGATIVE NEGATIVE   Amphetamines NEGATIVE NEGATIVE   Tetrahydrocannabinol NEGATIVE NEGATIVE   Barbiturates NEGATIVE NEGATIVE   Methadone Scn, Ur NEGATIVE NEGATIVE   Fentanyl  POSITIVE (A) NEGATIVE    Comment: (NOTE) Drug screen is for Medical Purposes only. Positive results are preliminary only. If confirmation is needed, notify lab within 5 days.  Drug Class                 Cutoff (ng/mL)  Amphetamine and metabolites 1000 Barbiturate and metabolites 200 Benzodiazepine              200 Opiates and metabolites     300 Cocaine and metabolites     300 THC                         50 Fentanyl                     5 Methadone                   300  Trazodone  is metabolized in vivo to several metabolites,  including pharmacologically active m-CPP, which is excreted in the  urine.  Immunoassay screens for amphetamines and MDMA have potential  cross-reactivity with these compounds and may provide false positive  result.  Performed at [facility], [address], [place], [place] [address]   Urinalysis, Routine w reflex microscopic -Urine, Clean Catch     Status: None   Collection Time: 11/28/24 12:33 PM  Result Value Ref Range    Color, Urine YELLOW YELLOW   APPearance CLEAR CLEAR   Specific Gravity, Urine 1.023 1.005 - 1.030   pH 7.0 5.0 - 8.0   Glucose, UA NEGATIVE NEGATIVE mg/dL   Hgb urine dipstick NEGATIVE NEGATIVE   Bilirubin Urine NEGATIVE NEGATIVE   Ketones, ur NEGATIVE NEGATIVE mg/dL   Protein, ur NEGATIVE NEGATIVE mg/dL   Nitrite NEGATIVE NEGATIVE   Leukocytes,Ua NEGATIVE NEGATIVE    Comment: Performed at [facility], [address] [place], [place] [address]    Blood Alcohol level:  Lab Results  Component Value Date   [facility] <15 11/27/2024   ETH <15 06/24/2024    Metabolic Disorder Labs:  No results found for: HGBA1C, MPG No results found for: PROLACTIN No results found for: CHOL, TRIG, HDL, CHOLHDL, VLDL, LDLCALC  Current Medications: Current Facility-Administered Medications  Medication Dose Route Frequency Provider Last Rate Last Admin   acetaminophen  (TYLENOL ) tablet 650 mg  650 mg Oral Q6H PRN Motley-Mangrum, Jadeka A, PMHNP   650 mg at 11/28/24 2041   alum & mag hydroxide-simeth (MAALOX/MYLANTA) 200-200-20 MG/5ML suspension 30 mL  30 mL Oral Q4H PRN Motley-Mangrum, Jadeka A, PMHNP       haloperidol  (HALDOL ) tablet 5 mg  5 mg Oral TID PRN Motley-Mangrum, Jadeka A, PMHNP       And   diphenhydrAMINE  (BENADRYL ) capsule 50 mg  50 mg Oral TID PRN Motley-Mangrum, Jadeka A, PMHNP       haloperidol  lactate (HALDOL ) injection 5 mg  5 mg Intramuscular TID PRN Motley-Mangrum, Jadeka A, PMHNP       And   diphenhydrAMINE  (BENADRYL ) injection 50 mg  50 mg Intramuscular TID PRN Motley-Mangrum, Jadeka A, PMHNP       And   LORazepam  (ATIVAN ) injection 2 mg  2 mg Intramuscular TID PRN Motley-Mangrum, Jadeka A, PMHNP       haloperidol  lactate (HALDOL ) injection 10 mg  10 mg Intramuscular TID PRN Motley-Mangrum, Jadeka A, PMHNP       And   diphenhydrAMINE  (BENADRYL ) injection 50 mg  50 mg Intramuscular TID PRN Motley-Mangrum, Jadeka A, PMHNP       And   LORazepam   (ATIVAN ) injection 2 mg  2 mg Intramuscular TID PRN Motley-Mangrum, Jadeka A, PMHNP       ibuprofen  (ADVIL ) tablet 400 mg  400 mg Oral Q6H PRN Raliegh Marsa RAMAN, DO  400 mg at 11/28/24 2041   lidocaine  (LIDODERM ) 5 % 2 patch  2 patch Transdermal Q24H Carrion-Carrero, Marlo, MD   2 patch at 11/29/24 1530   magnesium  hydroxide (MILK OF MAGNESIA) suspension 30 mL  30 mL Oral Daily PRN Motley-Mangrum, Jadeka A, PMHNP       nicotine  (NICODERM CQ  - dosed in mg/24 hours) patch 14 mg  14 mg Transdermal Daily Motley-Mangrum, Jadeka A, PMHNP   14 mg at 11/29/24 0901   OLANZapine  (ZYPREXA ) tablet 10 mg  10 mg Oral QHS Motley-Mangrum, Jadeka A, PMHNP   10 mg at 11/28/24 2041   PTA Medications: Medications Prior to Admission  Medication Sig Dispense Refill Last Dose/Taking   EPINEPHrine  0.3 mg/0.3 mL IJ SOAJ injection Inject 0.3 mg into the muscle as needed. (Patient not taking: Reported on 11/28/2024)      nicotine  (NICODERM CQ  - DOSED IN MG/24 HOURS) 14 mg/24hr patch Place 1 patch (14 mg total) onto the skin daily. (Patient not taking: Reported on 11/28/2024) 28 patch 0    OLANZapine  (ZYPREXA ) 10 MG tablet Take 1 tablet (10 mg total) by mouth at bedtime. (Patient not taking: Reported on 11/28/2024) 30 tablet 0     Psychiatric Specialty Exam:  Presentation  General Appearance:  Appropriate for Environment; Casual; Fairly Groomed  Eye Contact: Fair  Speech: Clear and Coherent; Normal Rate  Speech Volume: Normal    Mood and Affect  Mood: Anxious; Irritable  Affect: Congruent   Thought Process  Thought Processes: Coherent; Goal Directed; Linear  Descriptions of Associations: Intact  Orientation: Full (Time, Place and Person)  Thought Content: Logical; WDL  History of Schizophrenia/Schizoaffective disorder: No   Ideas of Reference: None  Suicidal Thoughts:Suicidal Thoughts: No  Homicidal Thoughts:Homicidal Thoughts: No   Sensorium  Memory: Immediate  Fair  Judgment: Fair  Insight: Poor   Executive Functions  Concentration: Good  Attention Span: Good  Recall: Good  Fund of Knowledge: Good  Language: Good   Psychomotor Activity  Psychomotor Activity:Psychomotor Activity: Normal   Assets  Assets: Communication Skills; Desire for Improvement; Resilience   Sleep  Sleep:Sleep: Good    Physical Exam: Physical Exam Vitals and nursing note reviewed.  HENT:     Head: Normocephalic and atraumatic.  Eyes:     Extraocular Movements: Extraocular movements intact.     Conjunctiva/sclera: Conjunctivae normal.  Pulmonary:     Effort: Pulmonary effort is normal. No respiratory distress.  Musculoskeletal:        General: Normal range of motion.  Skin:    General: Skin is warm and dry.  Neurological:     General: No focal deficit present.    Review of Systems  All other systems reviewed and are negative.  Blood pressure (!) 154/95, pulse 75, temperature 98.9 F (37.2 C), temperature source Oral, resp. rate 18, height 5' 10 (1.778 m), weight 101.2 kg, SpO2 100%. Body mass index is 32 kg/m.   Treatment Plan Summary: Daily contact with patient to assess and evaluate symptoms and progress in treatment and Medication management   ASSESSMENT: Male patient with a history of cocaine abuse and fentanyl  abuse who was initially admitted under IVC. He currently presents without symptoms concerning for an active primary psychotic disorder, and his prior presentation is most consistent with a substance-induced psychosis that has since resolved. He remains irritable but is cooperative with limited insight into the contribution of substance use to his presentation.  At this time, there are no acute psychiatric concerns and no grounds  to uphold the involuntary commitment. There was no evidence of psychosis. Disposition planning was discussed, including potential discharge tomorrow pending continued clinical  stability.   Diagnoses / Active Problems: Substance-induced psychosis Cocaine use disorder Opioid abuse   PLAN: Safety and Monitoring:  --  INVOLUNTARY  admission to inpatient psychiatric unit for safety, stabilization and treatment   --2nd exam completed, to be rescinded as there was no evidence of psychosis  -- Daily contact with patient to assess and evaluate symptoms and progress in treatment  -- Patient's case to be discussed in multi-disciplinary team meeting  -- Observation Level : q15 minute checks  -- Vital signs:  q12 hours  -- Precautions: suicide, elopement, and assault  2. Psychiatric Diagnoses and Treatment:  -Continue Zyprexa  10 mg nightly -- The risks/benefits/side-effects/alternatives to this medication were discussed in detail with the patient and time was given for questions. The patient consents to medication trial.              -- Metabolic profile and EKG monitoring obtained while on an atypical antipsychotic BMI: 32 kg/m Lipid Panel: Patient declines as he reports he will not be continuing psychotropic medications at discharge HbgA1c: Patient declines as he reports he will not be continuing psychotropic medications at discharge QTc: 421             -- Encouraged patient to participate in unit milieu and in scheduled group therapies   -- Short Term Goals: Ability to verbalize feelings will improve, Ability to disclose and discuss suicidal ideas, and Ability to identify and develop effective coping behaviors will improve  -- Long Term Goals: Improvement in symptoms so as ready for discharge Other PRNS:  acetaminophen , 650 mg, Q6H PRN alum & mag hydroxide-simeth, 30 mL, Q4H PRN haloperidol , 5 mg, TID PRN  And diphenhydrAMINE , 50 mg, TID PRN haloperidol  lactate, 5 mg, TID PRN  And diphenhydrAMINE , 50 mg, TID PRN  And LORazepam , 2 mg, TID PRN haloperidol  lactate, 10 mg, TID PRN  And diphenhydrAMINE , 50 mg, TID PRN  And LORazepam , 2 mg, TID  PRN ibuprofen , 400 mg, Q6H PRN magnesium  hydroxide, 30 mL, Daily PRN    Other labs reviewed on admission:  Labs: CBC and BMP are unremarkable; UA unremarkable, UDS positive for cocaine and fentanyl ; EtOH negative for toxicity EKG on 1/30, NSR, QTc 421   3. Medical Issues Being Addressed:   #Chronic pain Start lidocaine  patch As needed ibuprofen   #Tobacco Use Disorder  Nicotine  patch 21mg /24 hours ordered  Smoking cessation encouraged  4. Discharge Planning:   -- Social work and case management to assist with discharge planning and identification of hospital follow-up needs prior to discharge  -- Estimated Discharge Date: 11/30/2024  -- Discharge Concerns: Need to establish a safety plan; Medication compliance and effectiveness  -- Discharge Goals: Return home with outpatient referrals for mental health follow-up including medication management/psychotherapy   I certify that inpatient services furnished can reasonably be expected to improve the patient's condition.   This note was created using a voice recognition software as a result there may be grammatical errors inadvertently enclosed that do not reflect the nature of this encounter. Every attempt is made to correct such errors.   Signed: Dr. Marlo Rands, MD PGY-3, Psychiatry Residency  2/1/20264:37 PM      [1]  Social History Tobacco Use  Smoking Status Every Day   Current packs/day: 1.00   Average packs/day: 1 pack/day for 33.1 years (33.1 ttl pk-yrs)   Types: Cigarettes  Start date: 1993  Smokeless Tobacco Never  [2]  Allergies Allergen Reactions   Bee Venom Swelling and Anaphylaxis   Hornet Venom Anaphylaxis    Required epinephrine 

## 2024-11-29 NOTE — [facility] Group Notes (Signed)
 Adult Psychoeducational Group Note  Date:  11/29/2024 Time:  5:10 PM  Group Topic/Focus:  Wellness Toolbox:   The focus of this group is to discuss various aspects of wellness, balancing those aspects and exploring ways to increase the ability to experience wellness.  Patients will create a wellness toolbox for use upon discharge.  Participation Level:  Did Not Attend  Participation Quality:    Affect:    Cognitive:    Insight:   Engagement in Group:    Modes of Intervention:    Additional Comments:    Annett Berle Hoyer 11/29/2024, 5:10 PM

## 2024-11-29 NOTE — [facility] Group Notes (Signed)
 Adult Psychoeducational Group Note  Date:  11/29/2024 Time:  11:22 AM  Group Topic/Focus:  Developing a Wellness Toolbox:   The focus of this group is to help patients develop a wellness toolbox with skills and strategies to promote recovery upon discharge.  Participation Level:  Active  Participation Quality:  Resistant  Affect:  Anxious  Cognitive:  Lacking  Insight: Improving  Engagement in Group:  Improving  Modes of Intervention:  Education  Additional Comments:  y    Annett Berle Hoyer    11:22 AM   Date:  11/29/2024 Time:  11:22 AM  Group Topic/Focus:  Developing a Wellness Toolbox:   The focus of this group is to help patients develop a wellness toolbox with skills and strategies to promote recovery upon discharge.  Participation Level:  Did Not Attend  Participation Quality:    Affect:    Cognitive:    Insight:   Engagement in Group:    Modes of Intervention:    Additional Comments:    Annett Berle Hoyer 11/29/2024, 11:22 AM

## 2024-11-29 NOTE — Progress Notes (Signed)
(  Sleep Hours) -2.75 (Any PRNs that were needed, meds refused, or side effects to meds)- tylenol , ibuprofen  for pain (Any disturbances and when (visitation, over night)- none reported (Concerns raised by the patient)- none reported (SI/HI/AVH)- denies all

## 2024-11-29 NOTE — Group Note (Signed)
 Date:  11/29/2024 Time:  8:49 PM  Group Topic/Focus:  Wrap-Up Group:   The focus of this group is to help patients review their daily goal of treatment and discuss progress on daily workbooks.    Participation Level:  Did Not Attend   Phillip Anderson 11/29/2024, 8:49 PM

## 2024-11-29 NOTE — [facility] Group Notes (Signed)
 Adult Psychoeducational Group Note  Date:  11/29/2024 Time:  9:29 AM  Group Topic/Focus:  Goals Group:   The focus of this group is to help patients establish daily goals to achieve during treatment and discuss how the patient can incorporate goal setting into their daily lives to aide in recovery.  Participation Level:  Active  Participation Quality:  Sharing  Affect:  Angry  Cognitive:  Delusional  Insight: Limited  Engagement in Group:  Off Topic  Modes of Intervention:  Discussion  Additional Comments:    Annett Berle Hoyer 11/29/2024, 9:29 AM

## 2024-11-29 NOTE — Progress Notes (Signed)
(  Sleep Hours) -7.75  (Any PRNs that were needed, meds refused, or side effects to meds)- Pt refused Zyprexa  it gives me jumpy legs , pt refused Tylenol  and Advil  for leg pain -only emotional support given.   (Any disturbances and when (visitation, over night)-none  (Concerns raised by the patient)- pt stated he needed fentanyl  patches for his  legs , pt tried to get another nicotine  patch. When I came the last time they gave me two writer explained to pt that that was a lie, due to the order for Nicotine  patches.   (SI/HI/AVH)-denies

## 2024-11-29 NOTE — [facility] Suicide Risk Assessment (Signed)
 Suicide Risk Assessment  Admission Assessment    [facility] Admission Suicide Risk Assessment   Nursing information obtained from:  Patient Demographic factors:  Male, Low socioeconomic status Current Mental Status:  Suicidal ideation indicated by patient Loss Factors:  Financial problems / change in socioeconomic status, Decline in physical health Historical Factors:  NA Risk Reduction Factors:  NA  Total Time spent with patient: 1.5 hours Principal Problem: Brief psychotic disorder (HCC) Diagnosis:  Principal Problem:   Brief psychotic disorder (HCC)   Subjective Data:   CC: I need to go get my dog   Phillip Anderson is a 44 y.o. male  with a past psychiatric history of polysubstance use (cocaine, fentanyl , THC), prior psychiatric admission to [facility] on 05/2024 for substance-induced psychosis. Patient initially arrived to [facility] on 11/27/2024 for acute psychosis in setting of active substance use and medication nonadherence, and admitted to [facility] under IVC on 11/28/2024  for substance related issues.  UDS on 1/31 was positive for fentanyl  and cocaine.  PMHx is significant for hypertension, left shoulder tendinitis and back pain.      HPI:  Patient reports he is currently in an irritable mood and believes he is hospitalized due to a misunderstanding. He reports he initially presented to [facility] for chest pain and was stopped by staff when he attempted to leave the facility to retrieve his car, noting that he had arrived by car and had left his dog inside.  Patient reports that because he was involuntarily committed, his dog was taken to the pound, which he describes as a major source of stress and urgency, stating he wants to leave the hospital today to retrieve his dog.   Patient reports a history of prescribed fentanyl  patches for burn-related pain on his legs and states he does not use fentanyl  illicitly. He describes using cocaine approximately once to twice per month, characterizing the amount as  minimal, and reports he believes this explains the positive urine drug screen given recent use. Patient reports he does not believe his cocaine use contributed to his chest pain and states he does not feel that cocaine use leads to paranoid ideation.   Patient reports he denies suicidal ideation, homicidal ideation, and auditory or visual hallucinations. He describes psychosocial stressors including plans to move to a different residence and reports having a distant relationship with his mother.   Provides consent for me to contact friend for collateral:  Ulla [phone] - no answer, unable to leave a voicemail   Psychiatric ROS Mood Symptoms Denies any symptoms. Negative screening.   Manic Symptoms Denies any symptoms. Negative screening.   Anxiety Symptoms Denies any symptoms. Negative screening.   Psychosis Symptoms Denies any symptoms. Negative screening.   ____________________________________________________________________ History obtained from Consult note (see note from [facility] on 11/28/2024 ), reviewed and confirmed with patient   Psychiatric History:  Information collected from patient, patient mother and chart review   Information collected from patient and chart review   Prev Dx/Sx: polysubstance abuse Current Psych Provider: none Home Meds (current): none Previous Med Trials: none Therapy: none   Prior Psych Hospitalization: 2013 for polysubstance abuse Prior Self Harm: unknown Prior Violence: unknown   Family Psych History: unknown Family Hx suicide: unknown   Social History:  Developmental Hx: WNL Educational Hx: did not graduate high school Occupational Hx: Engineer, Materials Hx: unknown Living Situation: Lives with Mom Spiritual Hx: unknown Access to weapons/lethal means: unknown    Substance History Alcohol: yes  Type of  alcohol UTA Last Drink UTA Number of drinks per day UTA History of alcohol withdrawal seizures UTA History of  DT's UTA Tobacco: yes Illicit drugs: cocaine and THC Prescription drug abuse: denies Rehab hx: unknown     Access to firearms: Denies    Continued Clinical Symptoms:  Alcohol Use Disorder Identification Test Final Score (AUDIT): 0 The Alcohol Use Disorders Identification Test, Guidelines for Use in Primary Care, Second Edition.  [employer] [facility]). Score between 0-7:  no or low risk or alcohol related problems. Score between 8-15:  moderate risk of alcohol related problems. Score between 16-19:  high risk of alcohol related problems. Score 20 or above:  warrants further diagnostic evaluation for alcohol dependence and treatment.   CLINICAL FACTORS:   Alcohol/Substance Abuse/Dependencies Unstable or Poor Therapeutic Relationship   Psychiatric Specialty Exam:   Presentation  General Appearance:  Appropriate for Environment; Casual; Fairly Groomed   Eye Contact: Fair   Speech: Clear and Coherent; Normal Rate   Speech Volume: Normal       Mood and Affect  Mood: Anxious; Irritable   Affect: Congruent     Thought Process  Thought Processes: Coherent; Goal Directed; Linear   Descriptions of Associations: Intact   Orientation: Full (Time, Place and Person)   Thought Content: Logical; WDL   History of Schizophrenia/Schizoaffective disorder: No     Ideas of Reference: None   Suicidal Thoughts:Suicidal Thoughts: No   Homicidal Thoughts:Homicidal Thoughts: No     Sensorium  Memory: Immediate Fair   Judgment: Fair   Insight: Poor     Executive Functions  Concentration: Good   Attention Span: Good   Recall: Good   Fund of Knowledge: Good   Language: Good     Psychomotor Activity  Psychomotor Activity:Psychomotor Activity: Normal     Assets  Assets: Communication Skills; Desire for Improvement; Resilience     Sleep  Sleep:Sleep: Good       Physical Exam: Physical Exam Vitals and nursing note  reviewed.  HENT:     Head: Normocephalic and atraumatic.  Eyes:     Extraocular Movements: Extraocular movements intact.     Conjunctiva/sclera: Conjunctivae normal.  Pulmonary:     Effort: Pulmonary effort is normal. No respiratory distress.  Musculoskeletal:        General: Normal range of motion.  Skin:    General: Skin is warm and dry.  Neurological:     General: No focal deficit present.     Review of Systems  All other systems reviewed and are negative.  Blood pressure (!) 154/95, pulse 75, temperature 98.9 F (37.2 C), temperature source Oral, resp. rate 18, height 5' 10 (1.778 m), weight 101.2 kg, SpO2 100%. Body mass index is 32 kg/m.   COGNITIVE FEATURES THAT CONTRIBUTE TO RISK:  None    SUICIDE RISK:   Minimal: No identifiable suicidal ideation.  Patients presenting with no risk factors but with morbid ruminations; may be classified as minimal risk based on the severity of the depressive symptoms  PLAN OF CARE: See H&P for assessment and plan.   I certify that inpatient services furnished can reasonably be expected to improve the patient's condition.   Marlo Masson, MD 11/29/2024, 9:12 AM

## 2024-11-29 NOTE — Plan of Care (Signed)
   Problem: Education: Goal: Knowledge of [place] [facility] Education information/materials will improve Outcome: Progressing Goal: Emotional status will improve Outcome: Progressing Goal: Mental status will improve Outcome: Progressing Goal: Verbalization of understanding the information provided will improve Outcome: Progressing

## 2024-11-29 NOTE — Plan of Care (Signed)
  Problem: Activity: Goal: Sleeping patterns will improve Outcome: Progressing   Problem: Education: Goal: Emotional status will improve Outcome: Not Progressing Goal: Mental status will improve Outcome: Not Progressing   Problem: Activity: Goal: Interest or engagement in activities will improve Outcome: Not Progressing   

## 2024-11-30 MED ORDER — NICOTINE 14 MG/24HR TD PT24: 14.0000 mg | MEDICATED_PATCH | Freq: Every day | TRANSDERMAL | 0 refills | Status: AC

## 2024-11-30 MED ORDER — OLANZAPINE 10 MG PO TABS: 10.0000 mg | ORAL_TABLET | Freq: Every day | ORAL | 0 refills | Status: AC

## 2024-11-30 NOTE — Progress Notes (Signed)
 Patient discharged off unit at 1130. Patient belongings reviewed and acknowledged by patient. AVS and Transition Record reviewed and acknowledged by patient. Safety plan completed by patient, reviewed by nurse with patient and copy provided. Safety plan not filled in for several questions, patient resistant to completing. Any medications and or prescriptions necessary for discharge addressed and provided to patient. Patient denies SI, plan or intent. Denies HI. Denies AVH. Patient continues to report device implanted in chest and is upset he was admitted over needing an x-ray. Education unsuccessful. No observed or reported side effects to medication. No observed or reported agitation, aggression, or other acute emotional distress. No reported or observed physical abnormalities or concerns. Patient transportation from facility verified and observed.

## 2024-11-30 NOTE — [facility] Suicide Risk Assessment (Signed)
 [facility] INPATIENT:  Family/Significant Other Suicide Prevention Education  Suicide Prevention Education:   Suicide Prevention Education was reviewed thoroughly with patient, including risk factors, warning signs, and what to do. Mobile Crisis services were described and that telephone number pointed out, with encouragement to patient to put this number in personal cell phone. Brochure was provided to patient to share with natural supports. Patient acknowledged the ways in which they are at risk, and how working through each of their issues can gradually start to reduce their risk factors. Patient was encouraged to think of the information in the context of people in their own lives. Patient denied having access to firearms Patient verbalized understanding of information provided. Patient endorsed a desire to live.    Phillip Piacente, LCSW 11/30/2024

## 2024-11-30 NOTE — Progress Notes (Signed)
" °  [facility] Adult Case Management Discharge Plan :  Will you be returning to the same living situation after discharge:  Yes, patient lives with her mother At discharge, do you have transportation home?: Yes,  CSW arranged transportation through [facility] for 11:30 AM Do you have the ability to pay for your medications: Yes,  patient has insurance  Release of information consent forms completed and in the chart;  Patient's signature needed at discharge.  Patient to Follow up at:  Follow-up Information     [place] Go on 12/04/2024.   Specialty: Professional Counselor Why: Please go to this provider on 12/04/24 at 9:00 am to register for services. At this time, you will be scheduled for a clinical assessment, in order to obtain a therapy appointment. You may also go Monday thru Friday, from 9 am to 1 pm to register for services. Contact information: [address]  [address] [place] [address] [phone]         [facility]. Go on 12/08/2024.   Specialty: Behavioral Health Why: Please go to this provider on 12/08/24 at 7:00 am for an initial assessment, to obtain appointments for therapy and medication management services, as we were unable to contact prior to your discharge.  You may also go Monday through Friday, arrive by 7:00 am for assessments. Contact information: [address] [place]  [phone]                Next level of care provider has access to [facility] Link:no  Safety Planning and Suicide Prevention discussed: Yes,  with patient  Has patient been referred to the Quitline?: Patient refused referral for treatment  Patient has been referred for addiction treatment: Patient refused referral for treatment.   Sheron Robin O Kalya Troeger, LCSW 11/30/2024, 11:09 AM "
